# Patient Record
Sex: Male | Born: 1960 | Race: White | Hispanic: No | Smoking: Former smoker
Health system: Southern US, Community
[De-identification: ages and names within clinical notes are randomized; demographics above are authoritative.]

## PROBLEM LIST (undated history)

## (undated) DIAGNOSIS — E785 Hyperlipidemia, unspecified: Secondary | ICD-10-CM

## (undated) DIAGNOSIS — I10 Essential (primary) hypertension: Secondary | ICD-10-CM

## (undated) DIAGNOSIS — T7840XA Allergy, unspecified, initial encounter: Secondary | ICD-10-CM

## (undated) DIAGNOSIS — K219 Gastro-esophageal reflux disease without esophagitis: Secondary | ICD-10-CM

## (undated) DIAGNOSIS — Z9889 Other specified postprocedural states: Secondary | ICD-10-CM

## (undated) DIAGNOSIS — R112 Nausea with vomiting, unspecified: Secondary | ICD-10-CM

## (undated) HISTORY — DX: Essential (primary) hypertension: I10

## (undated) HISTORY — DX: Allergy, unspecified, initial encounter: T78.40XA

## (undated) HISTORY — DX: Hyperlipidemia, unspecified: E78.5

## (undated) HISTORY — PX: BACK SURGERY: SHX140

## (undated) HISTORY — PX: POLYPECTOMY: SHX149

## (undated) HISTORY — DX: Gastro-esophageal reflux disease without esophagitis: K21.9

## (undated) HISTORY — PX: HAND TENDON SURGERY: SHX663

---

## 2000-01-15 ENCOUNTER — Encounter: Payer: Self-pay | Admitting: Neurosurgery

## 2000-01-15 ENCOUNTER — Inpatient Hospital Stay (HOSPITAL_COMMUNITY): Admission: RE | Admit: 2000-01-15 | Discharge: 2000-01-17 | Payer: Self-pay | Admitting: Neurosurgery

## 2000-08-11 ENCOUNTER — Encounter: Payer: Self-pay | Admitting: Neurosurgery

## 2000-08-11 ENCOUNTER — Ambulatory Visit (HOSPITAL_COMMUNITY): Admission: RE | Admit: 2000-08-11 | Discharge: 2000-08-13 | Payer: Self-pay | Admitting: Neurosurgery

## 2006-11-03 ENCOUNTER — Ambulatory Visit: Payer: Self-pay | Admitting: Gastroenterology

## 2006-11-22 ENCOUNTER — Ambulatory Visit: Payer: Self-pay | Admitting: Gastroenterology

## 2006-11-22 ENCOUNTER — Encounter (INDEPENDENT_AMBULATORY_CARE_PROVIDER_SITE_OTHER): Payer: Self-pay | Admitting: Specialist

## 2007-03-09 ENCOUNTER — Ambulatory Visit (HOSPITAL_BASED_OUTPATIENT_CLINIC_OR_DEPARTMENT_OTHER): Admission: RE | Admit: 2007-03-09 | Discharge: 2007-03-09 | Payer: Self-pay | Admitting: Orthopedic Surgery

## 2010-12-29 NOTE — Op Note (Signed)
NAMEJAKHI, Jason Forbes                  ACCOUNT NO.:  [id number]   MEDICAL RECORD NO.:  [id number]          PATIENT TYPE:  AMB   LOCATION:  DSC                          FACILITY:  [facility]   PHYSICIAN:  Cindee Salt, M.D.       DATE OF BIRTH:  1960-09-15   DATE OF PROCEDURE:  03/09/2007  DATE OF DISCHARGE:                               OPERATIVE REPORT   PREOPERATIVE DIAGNOSIS:  Carpal tunnel syndrome left hand.   POSTOPERATIVE DIAGNOSIS:  Carpal tunnel syndrome left hand.   OPERATION:  Release left median nerve.   SURGEON:  Cindee Salt, M.D.   ASSISTANT:  Carolyne Fiscal R.N.   ANESTHESIA:  Forearm based IV regional with general anesthesia.   ANESTHESIOLOGIST:  Zenon Mayo, M.D.   HISTORY:  The patient is a 50 year old male with a history of carpal  tunnel syndrome, EMG nerve conductions positive.  This has not responded  to conservative treatment.  He is desirous of proceeding to have this  released.  He is aware of risks and complications including the  possibility of infection, recurrence, injury to arteries, nerves,  tendons, incomplete relief of symptoms, dystrophy.  He has elected to  proceed to have this done being aware of these risks and complications.  In the preoperative area, questions were encouraged and answered, the  extremity marked by both the patient and surgeon, antibiotic given.   PROCEDURE:  The patient is brought to the operating room where a forearm  based IV regional anesthetic was carried out without difficulty.  He was  prepped using DuraPrep, supine position, left arm free.  He had  considerable movement despite the anesthesia.  General anesthetic was  then carried out. After a dry time, he was draped.  A  longitudinal incision was made in the palm and carried down through the  subcutaneous tissue.  Bleeders were electrocauterized, palmar fascia was  split, superficial palmar arch was identified, flexor tendon of the ring  and little finger were  identified to the ulnar side of the median nerve.  The carpal retinaculum was incised with sharp dissection. Right angle  and Sewall retractor were placed between skin and forearm fascia.  The  fascia was released for approximately 1.5 cm proximal to the wrist  crease under direct vision. Considerable enlargement was noted to the  ulnar veins.  Guyon's cm canal was released. The area of constriction to  the nerve was apparent.  No further lesions were identified.  The wound  was irrigated.  The skin was closed with interrupted 5-0 Vicryl Rapide  sutures.  Sterile compressive dressing and splint applied.  The patient tolerated  the procedure well and was taken to the recovery room for observation in  satisfactory condition.  He is discharged home to return to the [facility] in one week on Vicodin.           ______________________________  Cindee Salt, M.D.     GK/MEDQ  D:  03/09/2007  T:  03/09/2007  Job:  [id number]   cc:   Lacretia Leigh. Quintella Reichert, M.D.

## 2011-01-01 NOTE — Op Note (Signed)
[place]. [facility]  Patient:    Jason Forbes, Jason Forbes                         MRN: [id number] Proc. Date: 01/15/00 Adm. Date:  [id number] Disc. Date: [id number] Attending:  Jackelyn Knife                           Operative Report  PREOPERATIVE DIAGNOSIS:  Ruptured intervertebral disc, left L5-S1.  POSTOPERATIVE DIAGNOSIS:  Ruptured intervertebral disc, left L5-S1.  OPERATION PERFORMED:  Left L5-S1 re-exploration and diskectomy.  SURGEON:  Izell [place]. Elesa Hacker, M.D.  ASSISTANT:  Mrs. Bonita Cox.  DESCRIPTION OF PROCEDURE:  Under general endotracheal anesthesia, the usual subperiosteal approach was carried out to the left L5-S1 interlaminar space. The proper operative level was confirmed with intraoperative x-rays.  Using the operating microscope and high-speed drill, the hemilaminotomy site was enlarged and the dural sac and nerve root identified and carefully retracted medially.  Initial efforts at retraction of the nerve root and dural sac were unsuccessful because of what proved to be a large soft tissue mass, which was a quite large extruded fragment, which was removed in one large piece.  Once this had been done, retraction was greatly facilitated.  A careful search was made for additional extruded fragments and none were found.  The interspace was cleaned of the few remaining bits of disk tissue. The wound was copiously irrigated with antibiotic solution and then closed in layers.  A sterile dressing was applied and the patient was taken to the recovery room in good condition. DD:  01/15/00 TD:  01/20/00 Job: [id number]

## 2011-01-01 NOTE — Discharge Summary (Signed)
[place]. [facility]  Patient:    Jason Forbes, Jason Forbes                         MRN: [id number] Adm. Date:  [id number] Disc. Date: [id number] Attending:  Jackelyn Knife                           Discharge Summary  SUMMARY:  Jason Forbes is a generally healthy 50 year old male who presented with low back with radiating left leg pain.  This has gone on for several weeks and did not respond to conservative measures.  A preoperative lumbosacral MRI study demonstrated a quite large recurrent ruptured intervertebral disk on the left at L5-S1.  Preoperative examination revealed severe dorsiflexion weakness on the left associated with significant sensory deficit involving L5 and S1.  After an appropriate preoperative work-up including an explanation of the goals and risks of the procedure, he was taken to the operating room on January 15, 2000.  He underwent a left L5-S1 hemilaminectomy and diskectomy with a finding of a quite large, complete extruded disk fragment.  His postoperative course was prolonged one day because of persistent postoperative nausea.  On the date of discharge, January 17, 2000, that nausea had cleared and he was up and about.  He had not yet had any improvement in his dorsiflexion weakness or sensory deficit.  He pain was much improved.  FINAL DIAGNOSIS:  Herniated intervertebral disk, left L5-S1 with a left L5 and S1 radiculopathy.  CONDITION ON DISCHARGE:  Improving.  INSTRUCTIONS: 1. Up ad lib with a 10 pound weightlifting restriction for four weeks. 2. Regular diet. 3. Back to see me in six days. 4. Discharge medication is Vicodin. DD:  01/17/00 TD:  01/20/00 Job: [id number]

## 2011-01-01 NOTE — Op Note (Signed)
[place]. [facility]  Patient:    Jason Forbes, Jason Forbes                         MRN: [id number] Proc. Date: 08/11/00 Adm. Date:  [id number] Attending:  Jackelyn Knife                           Operative Report  PREOPERATIVE DIAGNOSIS:  Recurrent ruptured intervertebral disk, left L5-S1.  POSTOPERATIVE DIAGNOSIS:  Recurrent ruptured intervertebral disk, left L5-S1.  OPERATION PERFORMED:  Re-exploration left L5-S1 with diskectomy.  SURGEON:  Izell [place]. Elesa Hacker, M.D.  ASSISTANT:  Clydene Fake, M.D.  ANESTHESIA:  General endotracheal.  DESCRIPTION OF PROCEDURE:  The left L5-S1 interlaminar space was exposed with care taken to protect the dura because of the amount of overlying epidural fibrotic tissue present.  A single cross-table lateral x-ray was taken to ensure the proper operative level.  Using the operating microscope and high speed drill the hemilaminotomy was enlarged in a cephalad direction so that the dura could be exposed and identified and then dissection could be carried caudally through the scar tissue.  This was done and the dural sac and nerve root were separated using blunt dissection.  The intervertebral disk space was identified and extruded disk material was noted in the area.  This was removed with a pituitary rongeurs.  The intervertebral disk space itself was then entered and a large quantity of partially extruded material was removed.  A careful search was then made for additional material and none was found.  The wound was copiously irrigated with antibiotic solution and then closed in layers.  A sterile dressing was applied and the patient was taken to recovery room in good condition. DD:  08/11/00 TD:  08/11/00 Job: [id number]
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
## 2011-05-31 LAB — I-STAT 8, (EC8 V) (CONVERTED LAB)
Acid-Base Excess: 2
BUN: 19
Bicarbonate: 25.7 — ABNORMAL HIGH
Chloride: 106
Glucose, Bld: 100 — ABNORMAL HIGH
HCT: 49
Hemoglobin: 16.7
Operator id: 123881
Potassium: 4
Sodium: 138
TCO2: 27
pCO2, Ven: 38.2 — ABNORMAL LOW
pH, Ven: 7.435 — ABNORMAL HIGH

## 2016-09-27 ENCOUNTER — Encounter: Payer: Self-pay | Admitting: Gastroenterology

## 2017-08-30 ENCOUNTER — Encounter: Payer: Self-pay | Admitting: Gastroenterology

## 2017-10-24 ENCOUNTER — Other Ambulatory Visit: Payer: Self-pay

## 2017-10-24 ENCOUNTER — Ambulatory Visit (AMBULATORY_SURGERY_CENTER): Payer: Self-pay

## 2017-10-24 VITALS — Ht 73.0 in | Wt 224.0 lb

## 2017-10-24 DIAGNOSIS — Z86010 Personal history of colonic polyps: Secondary | ICD-10-CM

## 2017-10-24 MED ORDER — NA SULFATE-K SULFATE-MG SULF 17.5-3.13-1.6 GM/177ML PO SOLN: 1.0000 | Freq: Once | ORAL | 0 refills | Status: AC

## 2017-10-24 NOTE — Progress Notes (Signed)
Denies allergies to eggs or soy products. Denies complication of anesthesia or sedation. Denies use of weight loss medication. Denies use of O2.   Emmi instructions declined.  

## 2017-10-25 ENCOUNTER — Encounter: Payer: Self-pay | Admitting: Gastroenterology

## 2017-11-07 ENCOUNTER — Encounter: Payer: Self-pay | Admitting: Gastroenterology

## 2017-11-07 ENCOUNTER — Other Ambulatory Visit: Payer: Self-pay

## 2017-11-07 ENCOUNTER — Ambulatory Visit (AMBULATORY_SURGERY_CENTER): Payer: PRIVATE HEALTH INSURANCE | Admitting: Gastroenterology

## 2017-11-07 VITALS — BP 125/78 | HR 61 | Temp 98.6°F | Resp 10 | Ht 73.0 in | Wt 224.0 lb

## 2017-11-07 DIAGNOSIS — D128 Benign neoplasm of rectum: Secondary | ICD-10-CM

## 2017-11-07 DIAGNOSIS — Z1211 Encounter for screening for malignant neoplasm of colon: Secondary | ICD-10-CM

## 2017-11-07 DIAGNOSIS — D125 Benign neoplasm of sigmoid colon: Secondary | ICD-10-CM | POA: Diagnosis not present

## 2017-11-07 DIAGNOSIS — D123 Benign neoplasm of transverse colon: Secondary | ICD-10-CM | POA: Diagnosis not present

## 2017-11-07 DIAGNOSIS — D129 Benign neoplasm of anus and anal canal: Secondary | ICD-10-CM

## 2017-11-07 DIAGNOSIS — Z1212 Encounter for screening for malignant neoplasm of rectum: Secondary | ICD-10-CM

## 2017-11-07 DIAGNOSIS — D124 Benign neoplasm of descending colon: Secondary | ICD-10-CM | POA: Diagnosis not present

## 2017-11-07 HISTORY — PX: COLONOSCOPY: SHX174

## 2017-11-07 MED ORDER — SODIUM CHLORIDE 0.9 % IV SOLN: 500.0000 mL | Freq: Once | INTRAVENOUS | Status: DC

## 2017-11-07 NOTE — Progress Notes (Signed)
Called to room to assist during endoscopic procedure.  Patient ID and intended procedure confirmed with present staff. Received instructions for my participation in the procedure from the performing physician.  

## 2017-11-07 NOTE — Op Note (Signed)
[place] Patient Name: Jason Forbes Procedure Date: 11/07/2017 9:49 AM MRN: [id number] Endoscopist: Ladene Artist , MD Age: 57 Referring MD:  Date of Birth: [birth year] Gender: Male Account #: [id number] Procedure:                Colonoscopy Indications:              Screening for colorectal malignant neoplasm Medicines:                Monitored Anesthesia Care Procedure:                Pre-Anesthesia Assessment:                           - Prior to the procedure, a History and Physical                            was performed, and patient medications and                            allergies were reviewed. The patient's tolerance of                            previous anesthesia was also reviewed. The risks                            and benefits of the procedure and the sedation                            options and risks were discussed with the patient.                            All questions were answered, and informed consent                            was obtained. Prior Anticoagulants: The patient has                            taken no previous anticoagulant or antiplatelet                            agents. ASA Grade Assessment: II - A patient with                            mild systemic disease. After reviewing the risks                            and benefits, the patient was deemed in                            satisfactory condition to undergo the procedure.                           After obtaining informed consent, the colonoscope  was passed under direct vision. Throughout the                            procedure, the patient's blood pressure, pulse, and                            oxygen saturations were monitored continuously. The                            Colonoscope was introduced through the anus and                            advanced to the the cecum, identified by                            appendiceal orifice and  ileocecal valve. The                            ileocecal valve, appendiceal orifice, and rectum                            were photographed. The quality of the bowel                            preparation was good. The colonoscopy was performed                            without difficulty. The patient tolerated the                            procedure well. Scope In: 10:01:56 AM Scope Out: 10:17:21 AM Scope Withdrawal Time: 0 hours 12 minutes 45 seconds  Total Procedure Duration: 0 hours 15 minutes 25 seconds  Findings:                 The perianal and digital rectal examinations were                            normal.                           A 8 mm polyp was found in the hepatic flexure. The                            polyp was semi-pedunculated. The polyp was removed                            with a cold snare. Resection and retrieval were                            complete.                           A 5 mm polyp was found in the descending colon. The  polyp was sessile. The polyp was removed with a                            cold biopsy forceps. Resection and retrieval were                            complete.                           Seven sessile polyps were found in the rectum (5)                            and sigmoid colon (2). The polyps were 5 to 6 mm in                            size. These polyps were removed with a cold snare.                            Resection and retrieval were complete.                           Internal hemorrhoids were found during                            retroflexion. The hemorrhoids were small and Grade                            I (internal hemorrhoids that do not prolapse).                           The exam was otherwise without abnormality on                            direct and retroflexion views. Complications:            No immediate complications. Estimated blood loss:                             None. Estimated Blood Loss:     Estimated blood loss: none. Impression:               - One 8 mm polyp at the hepatic flexure, removed                            with a cold snare. Resected and retrieved.                           - One 5 mm polyp in the descending colon, removed                            with a cold biopsy forceps. Resected and retrieved.                           - Seven 5 to 6 mm polyps in the rectum  and in the                            sigmoid colon, removed with a cold snare. Resected                            and retrieved.                           - Internal hemorrhoids.                           - The examination was otherwise normal on direct                            and retroflexion views. Recommendation:           - Repeat colonoscopy in 3 - 5 years for                            surveillance pending path review.                           - Patient has a contact number available for                            emergencies. The signs and symptoms of potential                            delayed complications were discussed with the                            patient. Return to normal activities tomorrow.                            Written discharge instructions were provided to the                            patient.                           - Resume previous diet.                           - Continue present medications.                           - Await pathology results. Ladene Artist, MD 11/07/2017 10:26:11 AM This report has been signed electronically.

## 2017-11-07 NOTE — Progress Notes (Signed)
Pt's states no medical or surgical changes since previsit or office visit. 

## 2017-11-07 NOTE — Progress Notes (Signed)
Report to PACU, RN, vss, BBS= Clear.  

## 2017-11-07 NOTE — Patient Instructions (Signed)
YOU HAD AN ENDOSCOPIC PROCEDURE TODAY AT THE [facility]:   Refer to the procedure report that was given to you for any specific questions about what was found during the examination.  If the procedure report does not answer your questions, please call your gastroenterologist to clarify.  If you requested that your care partner not be given the details of your procedure findings, then the procedure report has been included in a sealed envelope for you to review at your convenience later.  YOU SHOULD EXPECT: Some feelings of bloating in the abdomen. Passage of more gas than usual.  Walking can help get rid of the air that was put into your GI tract during the procedure and reduce the bloating. If you had a lower endoscopy (such as a colonoscopy or flexible sigmoidoscopy) you may notice spotting of blood in your stool or on the toilet paper. If you underwent a bowel prep for your procedure, you may not have a normal bowel movement for a few days.  Please Note:  You might notice some irritation and congestion in your nose or some drainage.  This is from the oxygen used during your procedure.  There is no need for concern and it should clear up in a day or so.  SYMPTOMS TO REPORT IMMEDIATELY:   Following lower endoscopy (colonoscopy or flexible sigmoidoscopy):  Excessive amounts of blood in the stool  Significant tenderness or worsening of abdominal pains  Swelling of the abdomen that is new, acute  Fever of 100F or higher  For urgent or emergent issues, a gastroenterologist can be reached at any hour by calling [phone].   DIET:  We do recommend a small meal at first, but then you may proceed to your regular diet.  Drink plenty of fluids but you should avoid alcoholic beverages for 24 hours.  MEDICATIONS: Continue present medications.  Please see handouts given to you by your recovery nurse.  ACTIVITY:  You should plan to take it easy for the rest of today and you should NOT  DRIVE or use heavy machinery until tomorrow (because of the sedation medicines used during the test).    FOLLOW UP: Our staff will call the number listed on your records the next business day following your procedure to check on you and address any questions or concerns that you may have regarding the information given to you following your procedure. If we do not reach you, we will leave a message.  However, if you are feeling well and you are not experiencing any problems, there is no need to return our call.  We will assume that you have returned to your regular daily activities without incident.  If any biopsies were taken you will be contacted by phone or by letter within the next 1-3 weeks.  Please call us at [phone] if you have not heard about the biopsies in 3 weeks.   Thank you for allowing us to provide for your healthcare needs today.   SIGNATURES/CONFIDENTIALITY: You and/or your care partner have signed paperwork which will be entered into your electronic medical record.  These signatures attest to the fact that that the information above on your After Visit Summary has been reviewed and is understood.  Full responsibility of the confidentiality of this discharge information lies with you and/or your care-partner. 

## 2017-11-08 ENCOUNTER — Telehealth: Payer: Self-pay | Admitting: *Deleted

## 2017-11-08 NOTE — Telephone Encounter (Signed)
  Follow up Call-  Call back number 11/07/2017  Post procedure Call Back phone  # [phone]  Permission to leave phone message Yes  Some recent data might be hidden     Patient questions:  Do you have a fever, pain , or abdominal swelling? No. Pain Score  0 *  Have you tolerated food without any problems? Yes.    Have you been able to return to your normal activities? Yes.    Do you have any questions about your discharge instructions: Diet   No. Medications  No. Follow up visit  No.  Do you have questions or concerns about your Care? No.  Actions: * If pain score is 4 or above: No action needed, pain <4.

## 2017-11-16 ENCOUNTER — Encounter: Payer: Self-pay | Admitting: Gastroenterology

## 2018-06-21 ENCOUNTER — Encounter: Payer: Self-pay | Admitting: Neurology

## 2018-06-22 ENCOUNTER — Other Ambulatory Visit: Payer: Self-pay | Admitting: *Deleted

## 2018-06-22 DIAGNOSIS — G5603 Carpal tunnel syndrome, bilateral upper limbs: Secondary | ICD-10-CM

## 2018-07-25 ENCOUNTER — Ambulatory Visit (INDEPENDENT_AMBULATORY_CARE_PROVIDER_SITE_OTHER): Payer: PRIVATE HEALTH INSURANCE | Admitting: Neurology

## 2018-07-25 DIAGNOSIS — G5603 Carpal tunnel syndrome, bilateral upper limbs: Secondary | ICD-10-CM | POA: Diagnosis not present

## 2018-07-25 NOTE — Procedures (Signed)
[facility]  [address], [address]  [place], [place] [address] Tel: [phone] Fax:  [phone] Test Date:  07/25/2018  Patient: Jason Forbes DOB: 1961/05/30 Physician: Narda Amber, DO  Sex: Male Height: 6\' 1"  Ref Phys: Daryll Brod, MD  ID#: [id number] Temp: 32.0C Technician:    Patient Complaints: This is a 57 year-old man with history of left CTS release referred for evaluation of right hand paresthesias.  NCV & EMG Findings: Extensive electrodiagnostic testing of the right upper extremity and additional studies of the left shows:  1. Right median sensory response shows prolonged distal peak latency (5.9 ms) and reduced amplitude (8.2 V).  Left median sensory response shows prolonged distal peak latency (4.2 ms) and normal amplitude.  Bilateral ulnar sensory responses are within normal limits. 2. Bilateral median motor responses show prolonged latency, which is worse on the right (R7.3, L4.3 ms).  Bilateral ulnar motor responses are within normal limits.   3. Chronic motor axonal loss changes are seen affecting the right abductor pollicis brevis muscle, without accompanied active denervation.    Impression: 1. Right median neuropathy at or distal to the wrist, consistent with a clinical diagnosis of carpal tunnel syndrome.  Overall, these findings are severe in degree electrically. 2. Left median neuropathy at or distal to the wrist, consistent with a clinical diagnosis of carpal tunnel syndrome, mild to-moderate in degree electrically.  These findings most likely represent the residuals of a previously treated left carpal tunnel syndrome.   ___________________________ Narda Amber, DO    Nerve Conduction Studies Anti Sensory Summary Table   Stim Site NR Peak (ms) Norm Peak (ms) P-T Amp (V) Norm P-T Amp  Left Median Anti Sensory (2nd Digit)  32C  Wrist    4.2 <3.6 17.9 >15  Right Median Anti Sensory (2nd Digit)  32C  Wrist    5.9 <3.6 8.2 >15  Left Ulnar  Anti Sensory (5th Digit)  32C  Wrist    3.0 <3.1 16.2 >10  Right Ulnar Anti Sensory (5th Digit)  32C  Wrist    2.8 <3.1 19.9 >10   Motor Summary Table   Stim Site NR Onset (ms) Norm Onset (ms) O-P Amp (mV) Norm O-P Amp Site1 Site2 Delta-0 (ms) Dist (cm) Vel (m/s) Norm Vel (m/s)  Left Median Motor (Abd Poll Brev)  32C  Wrist    4.3 <4.0 7.5 >6 Elbow Wrist 5.4 30.0 56 >50  Elbow    9.7  7.5         Right Median Motor (Abd Poll Brev)  32C  Wrist    7.3 <4.0 6.3 >6 Elbow Wrist 5.0 29.0 58 >50  Elbow    12.3  5.9         Left Ulnar Motor (Abd Dig Minimi)  32C  Wrist    2.3 <3.1 13.3 >7 B Elbow Wrist 4.9 26.0 53 >50  B Elbow    7.2  12.1  A Elbow B Elbow 2.0 10.0 50 >50  A Elbow    9.2  11.5         Right Ulnar Motor (Abd Dig Minimi)  32C  Wrist    2.1 <3.1 10.8 >7 B Elbow Wrist 3.9 24.0 62 >50  B Elbow    6.0  10.5  A Elbow B Elbow 1.9 10.0 53 >50  A Elbow    7.9  9.9          EMG   Side Muscle Ins Act Fibs Psw  Fasc Number Recrt Dur Dur. Amp Amp. Poly Poly. Comment  Right 1stDorInt Nml Nml Nml Nml Nml Nml Nml Nml Nml Nml Nml Nml N/A  Right Abd Poll Brev Nml Nml Nml Nml 1- Rapid Some 1+ Some 1+ Nml Nml N/A  Right PronatorTeres Nml Nml Nml Nml Nml Nml Nml Nml Nml Nml Nml Nml N/A  Right Biceps Nml Nml Nml Nml Nml Nml Nml Nml Nml Nml Nml Nml N/A  Right Triceps Nml Nml Nml Nml Nml Nml Nml Nml Nml Nml Nml Nml N/A  Right Deltoid Nml Nml Nml Nml Nml Nml Nml Nml Nml Nml Nml Nml N/A      Waveforms:

## 2018-07-28 ENCOUNTER — Other Ambulatory Visit: Payer: Self-pay | Admitting: Orthopedic Surgery

## 2018-08-04 ENCOUNTER — Encounter (HOSPITAL_BASED_OUTPATIENT_CLINIC_OR_DEPARTMENT_OTHER): Payer: Self-pay | Admitting: *Deleted

## 2018-08-04 ENCOUNTER — Other Ambulatory Visit: Payer: Self-pay

## 2018-08-10 ENCOUNTER — Encounter (HOSPITAL_BASED_OUTPATIENT_CLINIC_OR_DEPARTMENT_OTHER)
Admission: RE | Admit: 2018-08-10 | Discharge: 2018-08-10 | Disposition: A | Discharge status: Home / Self Care | Payer: PRIVATE HEALTH INSURANCE | Source: Ambulatory Visit | Attending: Orthopedic Surgery | Admitting: Orthopedic Surgery

## 2018-08-10 DIAGNOSIS — Z01818 Encounter for other preprocedural examination: Secondary | ICD-10-CM | POA: Diagnosis not present

## 2018-08-10 LAB — BASIC METABOLIC PANEL
Anion gap: 9 (ref 5–15)
BUN: 27 mg/dL — ABNORMAL HIGH (ref 6–20)
CO2: 27 mmol/L (ref 22–32)
Calcium: 9.4 mg/dL (ref 8.9–10.3)
Chloride: 101 mmol/L (ref 98–111)
Creatinine, Ser: 1.11 mg/dL (ref 0.61–1.24)
GFR calc Af Amer: >60 mL/min (ref >60)
GFR calc non Af Amer: >60 mL/min (ref >60)
Glucose, Bld: 107 mg/dL — ABNORMAL HIGH (ref 70–99)
Potassium: 4.6 mmol/L (ref 3.5–5.1)
Sodium: 137 mmol/L (ref 135–145)

## 2018-08-15 NOTE — Progress Notes (Signed)
Reviewed pts lab with Dr. Lissa Hoard, no new orders received .

## 2018-08-17 ENCOUNTER — Ambulatory Visit (HOSPITAL_BASED_OUTPATIENT_CLINIC_OR_DEPARTMENT_OTHER): Payer: PRIVATE HEALTH INSURANCE | Admitting: Anesthesiology

## 2018-08-17 ENCOUNTER — Ambulatory Visit (HOSPITAL_BASED_OUTPATIENT_CLINIC_OR_DEPARTMENT_OTHER)
Admission: RE | Admit: 2018-08-17 | Discharge: 2018-08-17 | Disposition: A | Discharge status: Home / Self Care | Payer: PRIVATE HEALTH INSURANCE | Attending: Orthopedic Surgery | Admitting: Orthopedic Surgery

## 2018-08-17 ENCOUNTER — Encounter (HOSPITAL_BASED_OUTPATIENT_CLINIC_OR_DEPARTMENT_OTHER)
Admission: RE | Disposition: A | Discharge status: Home / Self Care | Payer: Self-pay | Source: Home / Self Care | Attending: Orthopedic Surgery

## 2018-08-17 ENCOUNTER — Other Ambulatory Visit: Payer: Self-pay

## 2018-08-17 ENCOUNTER — Encounter (HOSPITAL_BASED_OUTPATIENT_CLINIC_OR_DEPARTMENT_OTHER): Payer: Self-pay | Admitting: Anesthesiology

## 2018-08-17 DIAGNOSIS — G5601 Carpal tunnel syndrome, right upper limb: Secondary | ICD-10-CM | POA: Insufficient documentation

## 2018-08-17 DIAGNOSIS — K219 Gastro-esophageal reflux disease without esophagitis: Secondary | ICD-10-CM | POA: Diagnosis not present

## 2018-08-17 DIAGNOSIS — Z885 Allergy status to narcotic agent status: Secondary | ICD-10-CM | POA: Diagnosis not present

## 2018-08-17 DIAGNOSIS — Z87891 Personal history of nicotine dependence: Secondary | ICD-10-CM | POA: Insufficient documentation

## 2018-08-17 DIAGNOSIS — E785 Hyperlipidemia, unspecified: Secondary | ICD-10-CM | POA: Diagnosis not present

## 2018-08-17 DIAGNOSIS — I10 Essential (primary) hypertension: Secondary | ICD-10-CM | POA: Diagnosis not present

## 2018-08-17 HISTORY — PX: CARPAL TUNNEL RELEASE: SHX101

## 2018-08-17 HISTORY — DX: Other specified postprocedural states: Z98.890

## 2018-08-17 HISTORY — DX: Other specified postprocedural states: R11.2

## 2018-08-17 SURGERY — CARPAL TUNNEL RELEASE
Anesthesia: General | Site: Wrist | Laterality: Right

## 2018-08-17 MED ORDER — PROPOFOL 10 MG/ML IV BOLUS
INTRAVENOUS | Status: DC | PRN
  Administered 2018-08-17: 140 mg via INTRAVENOUS
  Administered 2018-08-17 (×3): 20 mg via INTRAVENOUS

## 2018-08-17 MED ORDER — SCOPOLAMINE 1 MG/3DAYS TD PT72: 1.0000 | MEDICATED_PATCH | Freq: Once | TRANSDERMAL | Status: DC | PRN

## 2018-08-17 MED ORDER — LIDOCAINE 2% (20 MG/ML) 5 ML SYRINGE
INTRAMUSCULAR | Status: AC
  Filled 2018-08-17: qty 5

## 2018-08-17 MED ORDER — MIDAZOLAM HCL 2 MG/2ML IJ SOLN
1.0000 mg | INTRAMUSCULAR | Status: DC | PRN
  Administered 2018-08-17: 2 mg via INTRAVENOUS

## 2018-08-17 MED ORDER — ONDANSETRON HCL 4 MG/2ML IJ SOLN
INTRAMUSCULAR | Status: AC
  Filled 2018-08-17: qty 2

## 2018-08-17 MED ORDER — CEFAZOLIN SODIUM-DEXTROSE 2-4 GM/100ML-% IV SOLN
INTRAVENOUS | Status: AC
  Filled 2018-08-17: qty 100

## 2018-08-17 MED ORDER — BUPIVACAINE HCL (PF) 0.25 % IJ SOLN
INTRAMUSCULAR | Status: DC | PRN
  Administered 2018-08-17: 9 mL

## 2018-08-17 MED ORDER — FENTANYL CITRATE (PF) 100 MCG/2ML IJ SOLN
50.0000 ug | INTRAMUSCULAR | Status: DC | PRN
  Administered 2018-08-17: 100 ug via INTRAVENOUS

## 2018-08-17 MED ORDER — FENTANYL CITRATE (PF) 100 MCG/2ML IJ SOLN
INTRAMUSCULAR | Status: AC
  Filled 2018-08-17: qty 2

## 2018-08-17 MED ORDER — FENTANYL CITRATE (PF) 100 MCG/2ML IJ SOLN: 25.0000 ug | INTRAMUSCULAR | Status: DC | PRN

## 2018-08-17 MED ORDER — HYDROCODONE-ACETAMINOPHEN 5-325 MG PO TABS: 1.0000 | ORAL_TABLET | Freq: Four times a day (QID) | ORAL | 0 refills | Status: DC | PRN

## 2018-08-17 MED ORDER — MIDAZOLAM HCL 2 MG/2ML IJ SOLN
INTRAMUSCULAR | Status: AC
  Filled 2018-08-17: qty 2

## 2018-08-17 MED ORDER — ONDANSETRON HCL 4 MG/2ML IJ SOLN
INTRAMUSCULAR | Status: DC | PRN
  Administered 2018-08-17: 4 mg via INTRAVENOUS

## 2018-08-17 MED ORDER — LIDOCAINE HCL (CARDIAC) PF 100 MG/5ML IV SOSY
PREFILLED_SYRINGE | INTRAVENOUS | Status: DC | PRN
  Administered 2018-08-17: 20 mg via INTRAVENOUS

## 2018-08-17 MED ORDER — CEFAZOLIN SODIUM-DEXTROSE 2-4 GM/100ML-% IV SOLN
2.0000 g | INTRAVENOUS | Status: AC
  Administered 2018-08-17: 2 g via INTRAVENOUS

## 2018-08-17 MED ORDER — DEXAMETHASONE SODIUM PHOSPHATE 10 MG/ML IJ SOLN
INTRAMUSCULAR | Status: AC
  Filled 2018-08-17: qty 1

## 2018-08-17 MED ORDER — LIDOCAINE HCL (PF) 0.5 % IJ SOLN
INTRAMUSCULAR | Status: DC | PRN
  Administered 2018-08-17: 35 mL via INTRAVENOUS

## 2018-08-17 MED ORDER — CHLORHEXIDINE GLUCONATE 4 % EX LIQD: 60.0000 mL | Freq: Once | CUTANEOUS | Status: DC

## 2018-08-17 MED ORDER — LACTATED RINGERS IV SOLN
INTRAVENOUS | Status: DC
  Administered 2018-08-17: 09:00:00 via INTRAVENOUS

## 2018-08-17 MED ORDER — DEXAMETHASONE SODIUM PHOSPHATE 10 MG/ML IJ SOLN
INTRAMUSCULAR | Status: DC | PRN
  Administered 2018-08-17: 10 mg via INTRAVENOUS

## 2018-08-17 SURGICAL SUPPLY — 40 items
BLADE SURG 15 STRL LF DISP TIS (BLADE) ×1 IMPLANT
BLADE SURG 15 STRL SS (BLADE) ×3
BNDG CMPR 9X4 STRL LF SNTH (GAUZE/BANDAGES/DRESSINGS)
BNDG COHESIVE 3X5 TAN STRL LF (GAUZE/BANDAGES/DRESSINGS) ×3 IMPLANT
BNDG ESMARK 4X9 LF (GAUZE/BANDAGES/DRESSINGS) IMPLANT
BNDG GAUZE ELAST 4 BULKY (GAUZE/BANDAGES/DRESSINGS) ×3 IMPLANT
CHLORAPREP W/TINT 26ML (MISCELLANEOUS) ×3 IMPLANT
CORD BIPOLAR FORCEPS 12FT (ELECTRODE) ×3 IMPLANT
COVER BACK TABLE 60X90IN (DRAPES) ×3 IMPLANT
COVER MAYO STAND STRL (DRAPES) ×3 IMPLANT
COVER WAND RF STERILE (DRAPES) IMPLANT
CUFF TOURNIQUET SINGLE 18IN (TOURNIQUET CUFF) ×3 IMPLANT
DRAPE EXTREMITY T 121X128X90 (DISPOSABLE) ×3 IMPLANT
DRAPE SURG 17X23 STRL (DRAPES) ×3 IMPLANT
DRSG PAD ABDOMINAL 8X10 ST (GAUZE/BANDAGES/DRESSINGS) ×3 IMPLANT
GAUZE SPONGE 4X4 12PLY STRL (GAUZE/BANDAGES/DRESSINGS) ×3 IMPLANT
GAUZE XEROFORM 1X8 LF (GAUZE/BANDAGES/DRESSINGS) ×3 IMPLANT
GLOVE BIOGEL PI IND STRL 8 (GLOVE) IMPLANT
GLOVE BIOGEL PI IND STRL 8.5 (GLOVE) ×1 IMPLANT
GLOVE BIOGEL PI INDICATOR 8 (GLOVE) ×4
GLOVE BIOGEL PI INDICATOR 8.5 (GLOVE) ×2
GLOVE SURG ORTHO 8.0 STRL STRW (GLOVE) ×3 IMPLANT
GLOVE SURG SYN 8.0 (GLOVE) ×3 IMPLANT
GLOVE SURG SYN 8.0 PF PI (GLOVE) IMPLANT
GOWN STRL REIN XL XLG (GOWN DISPOSABLE) ×2 IMPLANT
GOWN STRL REUS W/ TWL LRG LVL3 (GOWN DISPOSABLE) ×1 IMPLANT
GOWN STRL REUS W/TWL LRG LVL3 (GOWN DISPOSABLE)
GOWN STRL REUS W/TWL XL LVL3 (GOWN DISPOSABLE) ×3 IMPLANT
NDL PRECISIONGLIDE 27X1.5 (NEEDLE) IMPLANT
NEEDLE PRECISIONGLIDE 27X1.5 (NEEDLE) ×3 IMPLANT
NS IRRIG 1000ML POUR BTL (IV SOLUTION) ×3 IMPLANT
PACK BASIN DAY SURGERY FS (CUSTOM PROCEDURE TRAY) ×3 IMPLANT
SLING ARM FOAM STRAP LRG (SOFTGOODS) ×2 IMPLANT
STOCKINETTE 4X48 STRL (DRAPES) ×3 IMPLANT
SUT ETHILON 4 0 PS 2 18 (SUTURE) ×3 IMPLANT
SUT VICRYL 4-0 PS2 18IN ABS (SUTURE) IMPLANT
SYR BULB 3OZ (MISCELLANEOUS) ×3 IMPLANT
SYR CONTROL 10ML LL (SYRINGE) ×2 IMPLANT
TOWEL GREEN STERILE FF (TOWEL DISPOSABLE) ×3 IMPLANT
UNDERPAD 30X30 (UNDERPADS AND DIAPERS) ×3 IMPLANT

## 2018-08-17 NOTE — Discharge Instructions (Addendum)

## 2018-08-17 NOTE — Anesthesia Procedure Notes (Signed)
Procedure Name: MAC Performed by: Namish Krise W, CRNA Pre-anesthesia Checklist: Patient identified, Emergency Drugs available, Suction available, Patient being monitored and Timeout performed Oxygen Delivery Method: Simple face mask       

## 2018-08-17 NOTE — Anesthesia Procedure Notes (Signed)
Procedure Name: LMA Insertion Performed by: Chassity Ludke W, CRNA Pre-anesthesia Checklist: Patient identified, Emergency Drugs available, Suction available and Patient being monitored Patient Re-evaluated:Patient Re-evaluated prior to induction Oxygen Delivery Method: Circle system utilized Preoxygenation: Pre-oxygenation with 100% oxygen Induction Type: IV induction Ventilation: Mask ventilation without difficulty LMA: LMA inserted LMA Size: 5.0 Number of attempts: 1 Placement Confirmation: positive ETCO2 Tube secured with: Tape Dental Injury: Teeth and Oropharynx as per pre-operative assessment        

## 2018-08-17 NOTE — Anesthesia Postprocedure Evaluation (Signed)
Anesthesia Post Note  Patient: Jason Forbes  Procedure(s) Performed: RIGHT CARPAL TUNNEL RELEASE (Right Wrist)     Patient location during evaluation: PACU Anesthesia Type: General Level of consciousness: awake and alert Pain management: pain level controlled Vital Signs Assessment: post-procedure vital signs reviewed and stable Respiratory status: spontaneous breathing, nonlabored ventilation, respiratory function stable and patient connected to nasal cannula oxygen Cardiovascular status: blood pressure returned to baseline and stable Postop Assessment: no apparent nausea or vomiting Anesthetic complications: no    Last Vitals:  Vitals:   08/17/18 1109 08/17/18 1125  BP: (!) 145/89 (!) 144/70  Pulse: (!) 53 (!) 55  Resp: 14 16  Temp:  36.5 C  SpO2: 100% 100%    Last Pain:  Vitals:   08/17/18 1125  TempSrc:   PainSc: 0-No pain                 Ebba Goll L Diandra Cimini

## 2018-08-17 NOTE — Transfer of Care (Signed)
Immediate Anesthesia Transfer of Care Note  Patient: Marisa Severin Gangwer  Procedure(s) Performed: RIGHT CARPAL TUNNEL RELEASE (Right Wrist)  Patient Location: PACU  Anesthesia Type:MAC, General and Bier block  Level of Consciousness: awake and sedated  Airway & Oxygen Therapy: Patient Spontanous Breathing and Patient connected to face mask oxygen  Post-op Assessment: Report given to RN and Post -op Vital signs reviewed and stable  Post vital signs: Reviewed and stable  Last Vitals:  Vitals Value Taken Time  BP 138/64 08/17/2018 10:47 AM  Temp    Pulse 67 08/17/2018 10:47 AM  Resp 9 08/17/2018 10:47 AM  SpO2 100 % 08/17/2018 10:47 AM  Vitals shown include unvalidated device data.  Last Pain:  Vitals:   08/17/18 0907  TempSrc: Oral         Complications: No apparent anesthesia complications

## 2018-08-17 NOTE — Anesthesia Procedure Notes (Signed)
Anesthesia Regional Block: Bier block (IV Regional)   Pre-Anesthetic Checklist: ,, timeout performed, Correct Patient, Correct Site, Correct Laterality, Correct Procedure,, site marked, surgical consent,, at surgeon's request  Laterality: Left     Needles:  Injection technique: Single-shot  Needle Type: Other      Needle Gauge: 20     Additional Needles:   Procedures:,,,,, intact distal pulses, Esmarch exsanguination, single tourniquet utilized,  Narrative:   Performed by: Personally       

## 2018-08-17 NOTE — H&P (Signed)
  Jason Forbes is an 58 y.o. male.   Chief Complaint: numbness right hand HPI: Jason Forbes is a 58 yo male  complaining of numbness and tingling right hand.Marland Kitchen He is undergone a carpal tunnel release on his left side. He is doing quite well with this. Complaining of numbness and tingling median nerve distribution on his right side. Gradually increasing. 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 He states that he frequently has to shake his hand if he writes his motorcycle he has 5 minutes before his right hand goes numb thumb through ring fingers. He is awakened 7 out of 7 nights. Is been wearing a brace. He states if he holds his hand straight it feels better. He is not taking anything for this. Is no history of diabetes thyroid problems arthritis or gout. Family history is negative for each of these also. He was referred for nerve conductions.He has had nerve conductions done by Dr. Posey Pronto revealing a carpal tunnel syndrome on his right side with a motor delay of 7.3 sensory delay of 5.9 he still has minor changes in his left post carpal tunnel release on nerve conductions.      Past Medical History:  Diagnosis Date  . Allergy   . GERD (gastroesophageal reflux disease)   . Hyperlipidemia   . Hypertension   . PONV (postoperative nausea and vomiting)    r/t morphine    Past Surgical History:  Procedure Laterality Date  . BACK SURGERY     Back surgery x 3  . HAND TENDON SURGERY Right    Hand surgery x#    Family History  Problem Relation Age of Onset  . Colon cancer Neg Hx   . Esophageal cancer Neg Hx   . Liver cancer Neg Hx   . Pancreatic cancer Neg Hx   . Rectal cancer Neg Hx   . Stomach cancer Neg Hx    Social History:  reports that he quit smoking about 13 years ago. He has quit using smokeless tobacco. He reports current alcohol use. He reports that he does not use drugs.  Allergies:  Allergies  Allergen Reactions  . Morphine And Related Nausea Only    No  medications prior to admission.    No results found for this or any previous visit (from the past 48 hour(s)).  No results found.   Pertinent items are noted in HPI.  Height 6\' 1"  (1.854 m), weight 103.4 kg.  General appearance: alert, cooperative and appears stated age Head: Normocephalic, without obvious abnormality Neck: no JVD Resp: clear to auscultation bilaterally Cardio: regular rate and rhythm, S1, S2 normal, no murmur, click, rub or gallop GI: soft, non-tender; bowel sounds normal; no masses,  no organomegaly Extremities: numbness right hand Pulses: 2+ and symmetric Skin: Skin color, texture, turgor normal. No rashes or lesions Neurologic: Grossly normal Incision/Wound: na  Assessment/Plan Assessment:  1. Carpal tunnel syndrome of right wrist    Plan: Is desires having the right carpal tunnel release. Pre-peri-and postoperative course are discussed along with risks and complications. He is aware that there is no guarantee to the surgery the possibility of infection recurrence injury to arteries nerves tendons complete relief symptoms dystrophy. He is scheduled for right carpal tunnel release in outpatient under regional anesthesia.   Daryll Brod 08/17/2018, 5:05 AM

## 2018-08-17 NOTE — Brief Op Note (Signed)
08/17/2018  10:41 AM  PATIENT:  Jason Forbes  58 y.o. male  PRE-OPERATIVE DIAGNOSIS:  RIGHT CARPAL TUNNEL SYNDROME  POST-OPERATIVE DIAGNOSIS:  RIGHT CARPAL TUNNEL SYNDROME  PROCEDURE:  Procedure(s): RIGHT CARPAL TUNNEL RELEASE (Right)  SURGEON:  Surgeon(s) and Role:    * Daryll Brod, MD - Primary  PHYSICIAN ASSISTANT:   ASSISTANTS: none   ANESTHESIA:   local, regional and general  EBL:  1 mL   BLOOD ADMINISTERED:none  DRAINS: none   LOCAL MEDICATIONS USED:  BUPIVICAINE   SPECIMEN:  No Specimen  DISPOSITION OF SPECIMEN:  N/A  COUNTS:  YES  TOURNIQUET:   Total Tourniquet Time Documented: Forearm (Right) - 27 minutes Total: Forearm (Right) - 27 minutes   DICTATION: .Viviann Spare Dictation  PLAN OF CARE: Discharge to home after PACU  PATIENT DISPOSITION:  PACU - hemodynamically stable.

## 2018-08-17 NOTE — Anesthesia Preprocedure Evaluation (Addendum)
Anesthesia Evaluation  Patient identified by MRN, date of birth, ID band Patient awake    Reviewed: Allergy & Precautions, NPO status , Patient's Chart, lab work & pertinent test results  History of Anesthesia Complications (+) PONV and history of anesthetic complications  Airway Mallampati: I  TM Distance: >3 FB Neck ROM: Full    Dental no notable dental hx. (+) Teeth Intact, Dental Advisory Given   Pulmonary neg pulmonary ROS, former smoker,    Pulmonary exam normal breath sounds clear to auscultation       Cardiovascular hypertension, Pt. on medications negative cardio ROS Normal cardiovascular exam Rhythm:Regular Rate:Normal     Neuro/Psych negative neurological ROS  negative psych ROS   GI/Hepatic Neg liver ROS, GERD  Medicated,  Endo/Other  negative endocrine ROS  Renal/GU negative Renal ROS  negative genitourinary   Musculoskeletal negative musculoskeletal ROS (+)   Abdominal   Peds  Hematology negative hematology ROS (+)   Anesthesia Other Findings Right carpal tunnel syndrome  Reproductive/Obstetrics                            Anesthesia Physical Anesthesia Plan  ASA: II  Anesthesia Plan: Bier Block and General and Bier Block-LIDOCAINE ONLY   Post-op Pain Management:    Induction: Intravenous  PONV Risk Score and Plan: 2 and Ondansetron, Dexamethasone, Midazolam, Treatment may vary due to age or medical condition and Propofol infusion  Airway Management Planned: LMA  Additional Equipment:   Intra-op Plan:   Post-operative Plan: Extubation in OR  Informed Consent: I have reviewed the patients History and Physical, chart, labs and discussed the procedure including the risks, benefits and alternatives for the proposed anesthesia with the patient or authorized representative who has indicated his/her understanding and acceptance.   Dental advisory given  Plan  Discussed with: CRNA  Anesthesia Plan Comments:        Anesthesia Quick Evaluation

## 2018-08-17 NOTE — Op Note (Signed)
NAME: Jason Forbes MEDICAL RECORD NO: [id number] DATE OF BIRTH: 1961/03/20 FAC[facility] LOCATION: [facility] PHYSICIAN: Wynonia Sours, MD   OPERATIVE REPORT   DATE OF PROCEDURE: 08/17/18    PREOPERATIVE DIAGNOSIS: Carpal tunnel syndrome right hand   POSTOPERATIVE DIAGNOSIS:   Same   PROCEDURE:  Decompression median nerve right hand   SURGEON: Daryll Brod, M.D.   ASSISTANT: none   ANESTHESIA:  General, Bier block with sedation and Local   INTRAVENOUS FLUIDS:  Per anesthesia flow sheet.   ESTIMATED BLOOD LOSS:  Minimal.   COMPLICATIONS:  None.   SPECIMENS:  none   TOURNIQUET TIME:    Total Tourniquet Time Documented: Forearm (Right) - 27 minutes Total: Forearm (Right) - 27 minutes    DISPOSITION:  Stable to PACU.   INDICATIONS: Patient is a 58 year old male with a history of numbness and tingling to his right hand.  Is undergone carpal tunnel release on the left side the past.  Nerve conductions are positive for carpal tunnel syndrome on his right side.  He has elected to undergo surgical decompression.  Pre-peri-and postoperative course been discussed along with risks and complications.  He is aware that there is no guarantee to the surgery the possibility of infection recurrence injury to arteries nerves tendons and complete relief of symptoms and dystrophy.  In the preoperative area the patient is seen extremity marked by both patient and surgeon antibiotic given  OPERATIVE COURSE: Patient is brought to the operating room where a forearm-based IV regional anesthetic was carried out without difficulty under the direction the anesthesia department.  He was prepped using ChloraPrep in the supine position with the right arm free.  A three-minute dry time was allowed timeout taken to confirm patient procedure.  A longitudinal incision was made in the right palm carried down through subcutaneous tissue.  Had some feeling a block was supplemented with local  anesthetic he still had some feeling a general anesthetic was given.  The dissection was carried down through the subcutaneous tissue with blunt dissection.  The palmar fascia was split.  The superficial palmar arch was identified.  The flexor tendon to the ring little finger was identified.  To the ulnar side of the median nerve the carpal retinaculum was incised with sharp dissection.  A right angled saw retractor was placed between skin and forearm fascia.  The fascia was released for approximately 2 to 3 cm proximal to the wrist crease under direct vision.  Canal was explored.  No further lesions were identified.  Motor branch entered in the muscle distally.  An area compression of the nerve was apparent.  No further lesions were noted.  His wound was copiously irrigated with saline.  The skin was closed interrupted 4-0 nylon sutures.  A further infiltration with quarter percent bupivacaine without epinephrine for a total of 8 cc was given.  Sterile compressive dressing with the fingers free was applied.  Inflation of the tourniquet all fingers immediately pink.  He was taken to the recovery room for observation in satisfactory condition.  He will be discharged home to return to [facility] Tylenol or ibuprofen with Norco for breakthrough.   Daryll Brod, MD Electronically signed, 08/17/18

## 2018-08-18 ENCOUNTER — Encounter (HOSPITAL_BASED_OUTPATIENT_CLINIC_OR_DEPARTMENT_OTHER): Payer: Self-pay | Admitting: Orthopedic Surgery

## 2019-10-08 ENCOUNTER — Ambulatory Visit: Payer: PRIVATE HEALTH INSURANCE | Admitting: Cardiology

## 2020-06-09 ENCOUNTER — Ambulatory Visit: Payer: PRIVATE HEALTH INSURANCE | Admitting: Family Medicine

## 2020-06-10 ENCOUNTER — Ambulatory Visit
Admission: RE | Admit: 2020-06-10 | Discharge: 2020-06-10 | Disposition: A | Discharge status: Home / Self Care | Payer: PRIVATE HEALTH INSURANCE | Source: Ambulatory Visit | Attending: Sports Medicine | Admitting: Sports Medicine

## 2020-06-10 ENCOUNTER — Other Ambulatory Visit: Payer: Self-pay

## 2020-06-10 ENCOUNTER — Ambulatory Visit: Payer: PRIVATE HEALTH INSURANCE | Admitting: Sports Medicine

## 2020-06-10 VITALS — BP 132/82 | Ht 73.0 in | Wt 227.0 lb

## 2020-06-10 DIAGNOSIS — M25551 Pain in right hip: Secondary | ICD-10-CM

## 2020-06-10 DIAGNOSIS — M21372 Foot drop, left foot: Secondary | ICD-10-CM | POA: Insufficient documentation

## 2020-06-10 DIAGNOSIS — G8929 Other chronic pain: Secondary | ICD-10-CM

## 2020-06-10 DIAGNOSIS — M545 Low back pain, unspecified: Secondary | ICD-10-CM

## 2020-06-10 HISTORY — DX: Low back pain, unspecified: M54.50

## 2020-06-10 HISTORY — DX: Pain in right hip: M25.551

## 2020-06-10 HISTORY — DX: Foot drop, left foot: M21.372

## 2020-06-10 NOTE — Patient Instructions (Signed)
We will send you for x-rays of your back and your right hip.  Go to [place] at [facility].  You do not need an appointment.  We will see about getting you a brace for your foot drop and call you in regards to this.  We will also call about your x-rays when we get them back.  We will plan to coordinate a visit in the future with you to be able to get your brace and an injection in your hip at the same time.  The hip injection will help [place] to decide if your hip arthritis is causing the pain or if it is coming from your back.  Your foot pain seems to be coming from your back.

## 2020-06-10 NOTE — Assessment & Plan Note (Signed)
Suspect that his foot pain is secondary to nerve root compression in his back given that the pain is burning in his bilateral feet and his feet are without significant abnormality.  Will obtain lumbar films for updated imaging to further assess.  We will discuss further plans with patient after imaging is obtained.

## 2020-06-10 NOTE — Assessment & Plan Note (Signed)
He does have some pain with extreme of internal rotation and with radiation to groin, it is possible that he has some intra-articular hip arthritis that is causing some of the symptoms.  With his significant low back history, it is also possible that he is having pain radiating from his low back and nerve compression.  Discussed with patient that we will obtain x-rays of right hip as well as lumbar spine to further assess.  We will work on getting him back for intra-articular injection for treatment as well as diagnostic purposes to see if this improves his pain.  If pain is improved, would support hip as the source of his pain.

## 2020-06-10 NOTE — Progress Notes (Signed)
Jason Forbes is a 59 y.o. 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  He reports that his surgery was performed by Dr. Rolin Barry, states that he did not have a fusion.  Also reports that he had a bone graft from the right hip in 1988.  Right Hip Pain He presents today to discuss several years of off-and-on right hip pain States that the pain is always worse when he is sitting only, mostly when his hip is in a slightly flexed position Feels pulling in his groin as well as pain in his buttocks to his thigh with numbness and aching in the area Reports that this occurs when he is driving States that he does not carry a wallet States that once he is able to get up and move his leg, the pain improves He was seen by Dr. 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 Chronic left foot drop ROS as above. Medications reviewed.  Exam:  BP 132/82   Ht 6\' 1"  (1.854 m)   Wt 227 lb (103 kg)   BMI 29.95 kg/m  Gen: Well NAD MSK:  Right Hip:  - Inspection: No gross deformity, no swelling, erythema, or ecchymosis b/l - Palpation: No TTP, specifically none over greater trochanter b/l, some TTP right sciatic notch and piriformin - ROM: Normal range of motion on Flexion, extension, abduction,  internal and external rotation b/l, some pain in right hip with extreme of internal rotation - Strength: Normal strength in all fields b/l. - Neuro/vasc: NV intact distally - Special Tests: Negative FABER and FADIR b/l.  Negative Log roll b/l.  Bilateral Feet:  Inspection:  No obvious bony deformity.  No swelling, erythema, or bruising.  Normal arch.  Mild collapse of transverse arch with splaying of first of second toes on right.   Palpation: Mild TTP right origin of plantar fascia on right, palpable plantar fascia on right with mild tenderness.  No tenderness palpation of left plantar fascia origin. ROM: Full  ROM of the ankle b/l. Normal midfoot flexibility Strength: 5/5 strength ankle in all planes on right Neurovascular: N/V intact distally in the lower extremity on right, decreased sensation on lateral lower left leg and 4/5 strength in all fields of left lower leg/ankle. Special tests: Negative anterior drawer, talar tilt bilateral. Negative squeeze.  Gait: Foot drop on left    No results found.   Assessment and Plan: 1) Right hip pain He does have some pain with extreme of internal rotation and with radiation to groin, it is possible that he has some intra-articular hip arthritis that is causing some of the symptoms.  With his significant low back history, it is also possible that he  is having pain radiating from his low back and nerve compression.  Discussed with patient that we will obtain x-rays of right hip as well as lumbar spine to further assess.  We will work on getting him back for intra-articular injection for treatment as well as diagnostic purposes to see if this improves his pain.  If pain is improved, would support hip as the source of his pain.  Chronic low back pain Suspect that his foot pain is secondary to nerve root compression in his back given that the pain is burning in his bilateral feet and his feet are without significant abnormality.  Will obtain lumbar films  for updated imaging to further assess.  We will discuss further plans with patient after imaging is obtained.  Foot drop, left Chronic left foot drop secondary to patient's previous lumbar problems.  He has never used a brace.  We will work on getting him an Allred brace for his safety to avoid falling.  Once this is set up, plan to bring him back the same day to get femoral acetabular corticosteroid injection as he lives in [place].   [place] Constable, D.O.  PGY-3 Family Medicine  06/10/2020 4:48 PM   Patient seen and evaluated with the resident.  I agree with the above plan of care.  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  We will order an x-ray to evaluate further.  Patient's bilateral foot pain is neuropathic in nature, likely due to his low back.  I will also get x-rays of his lumbar spine. Patient would like to try an AFO for his left foot drop.  I think he would benefit from an Allred AFO so we will start the process of ordering one for him. Patient lives in [place] so when he returns to [facility] for his brace then we may coordinate a diagnostic/therapeutic right hip intra-articular cortisone injection.

## 2020-06-10 NOTE — Assessment & Plan Note (Signed)
Chronic left foot drop secondary to patient's previous lumbar problems.  He has never used a brace.  We will work on getting him an Allred brace for his safety to avoid falling.  Once this is set up, plan to bring him back the same day to get femoral acetabular corticosteroid injection as he lives in [place].

## 2020-06-12 MED ORDER — AMBULATORY NON FORMULARY MEDICATION: 0 refills | Status: DC

## 2020-06-12 NOTE — Addendum Note (Signed)
Addended by: Cyd Silence on: 06/12/2020 11:25 AM   Modules accepted: Orders

## 2020-06-13 ENCOUNTER — Telehealth: Payer: Self-pay | Admitting: Sports Medicine

## 2020-06-13 NOTE — Telephone Encounter (Signed)
  I spoke with the patient on the phone yesterday after reviewing x-rays of his right hip and his lumbar spine.  Right hip x-ray shows mild degenerative changes.  Lumbar spine x-ray shows spondylosis at L4/L5 and L5/S1 with concomitant facet hypertrophy.  I discussed the possibility of a diagnostic/therapeutic right hip injection but the patient would like to hold on that for now.  We also placed an order for his Allrad AFO for his left foot. [employer] we'll fit him for that brace, and I have asked him to call me when this occurs.  I would like to plan on setting up a follow-up appointment a couple of weeks later to see how the brace is functioning for him.  We also discussed custom orthotics for his chronic bilateral foot pain which very well may be coming from his lumbar spine.  Although he has already spent quite a bit of money on off-the-shelf orthotics, he is willing to try our orthotics too.

## 2020-06-18 ENCOUNTER — Ambulatory Visit: Payer: PRIVATE HEALTH INSURANCE | Admitting: Family Medicine

## 2020-06-19 ENCOUNTER — Telehealth: Payer: Self-pay | Admitting: Sports Medicine

## 2020-06-19 NOTE — Telephone Encounter (Signed)
Forwarding message to med team thae Harrington Challenger @ [employer] wants to switch pt's Allred brace to a Lt wt carbon brace --pt has tried it on & is loving the feel. Call [facility] @ [phone].  --Dion Body

## 2020-10-28 ENCOUNTER — Encounter: Payer: PRIVATE HEALTH INSURANCE | Admitting: Sports Medicine

## 2020-12-30 ENCOUNTER — Encounter: Payer: Self-pay | Admitting: Gastroenterology

## 2021-08-15 IMAGING — CR DG HIP (WITH OR WITHOUT PELVIS) 2-3V*R*
2 series · 2 of 2 positions shown · non-contrast
Comparison: None.

CLINICAL DATA: Low back and right hip pain

EXAM:
DG HIP (WITH OR WITHOUT PELVIS) 2-3V RIGHT

[t hip ap right]
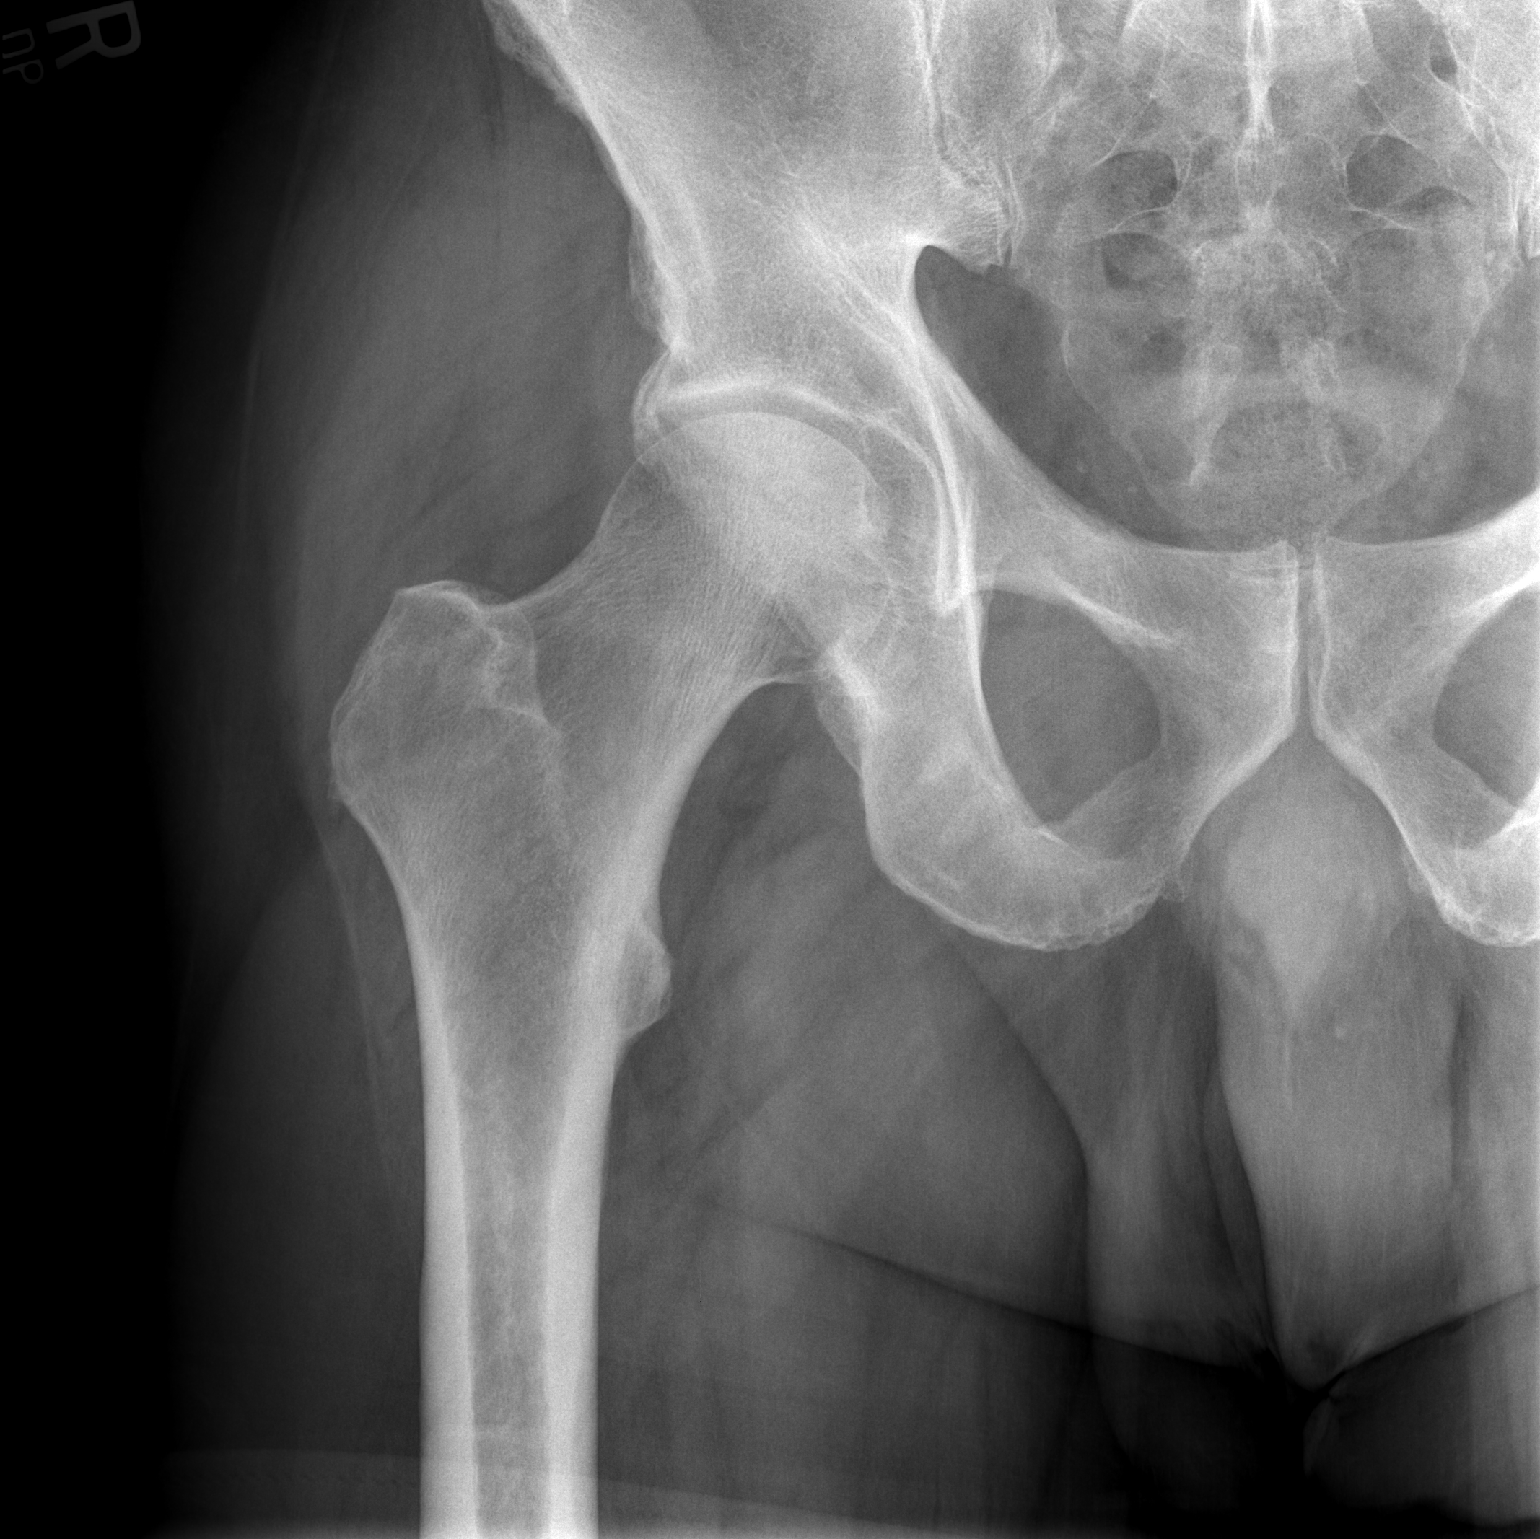

[t hip frog leg right]
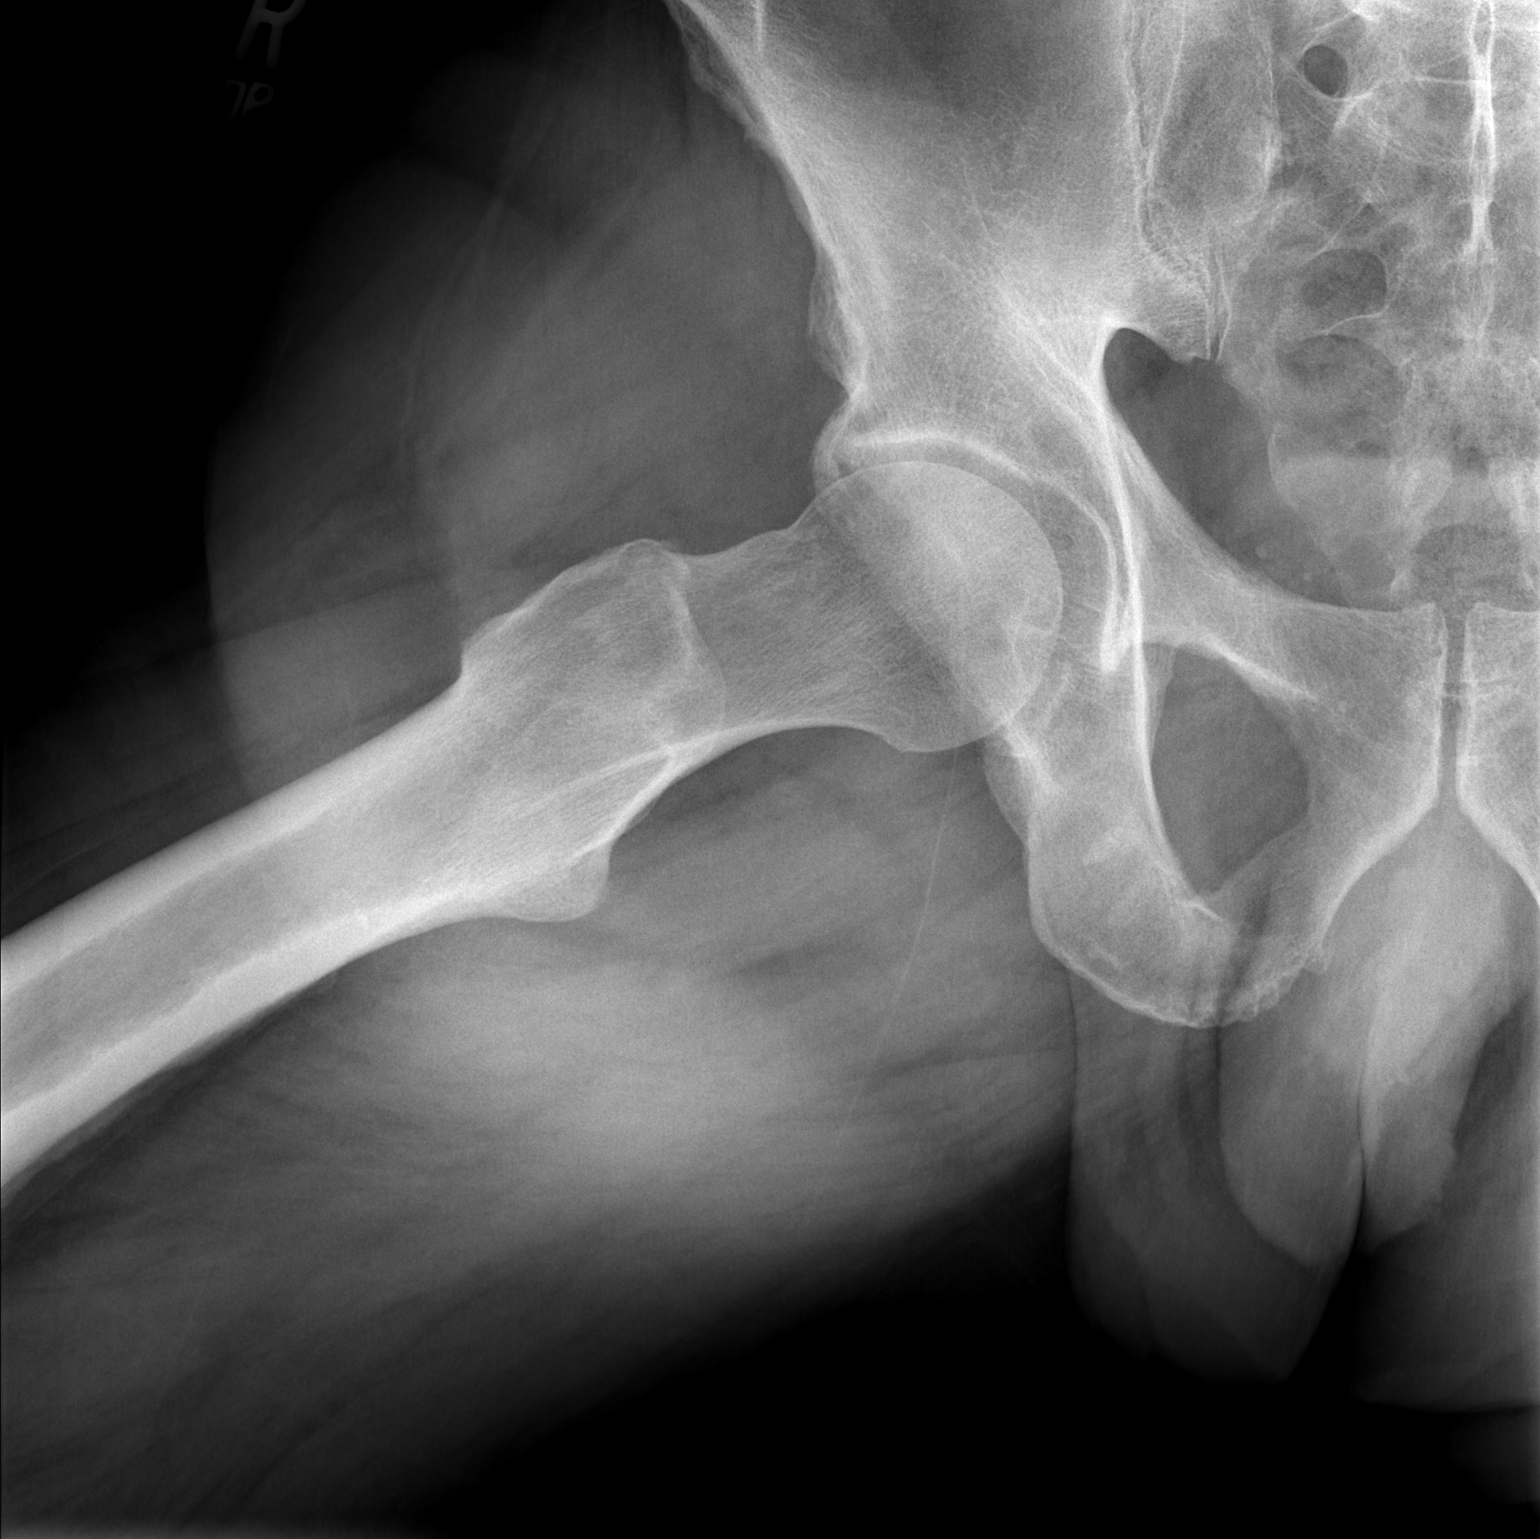

[2 of 2 positions shown; findings below may reference images not displayed]

FINDINGS: Frontal and frogleg lateral views of the right hip are obtained. No
fracture, subluxation, or dislocation. There is mild osteoarthritis
with osteophyte formation along the superolateral aspect of the
acetabulum. Mild superior joint space narrowing. Soft tissues are
normal.
IMPRESSION: 1. Mild right hip osteoarthritis.

## 2021-08-15 IMAGING — CR DG LUMBAR SPINE 2-3V
3 series · 3 of 3 positions shown · non-contrast
Comparison: None.

CLINICAL DATA: Low back pain, right hip pain

EXAM:
LUMBAR SPINE - 2-3 VIEW

[t l-spine a.p.]
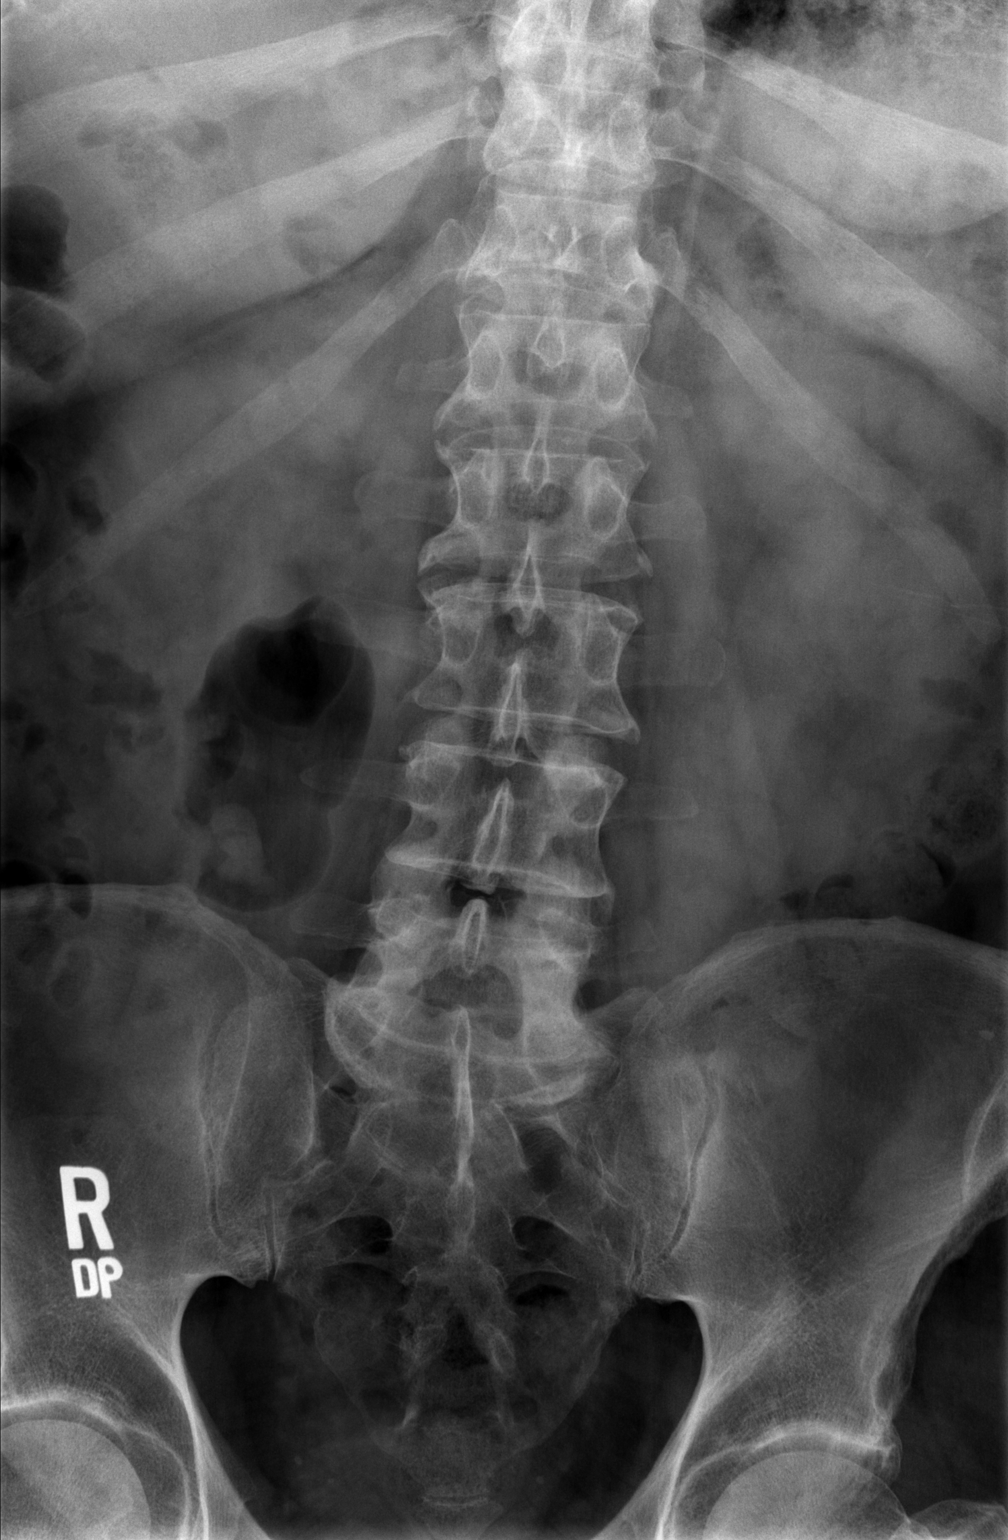

[t l-spine lat]
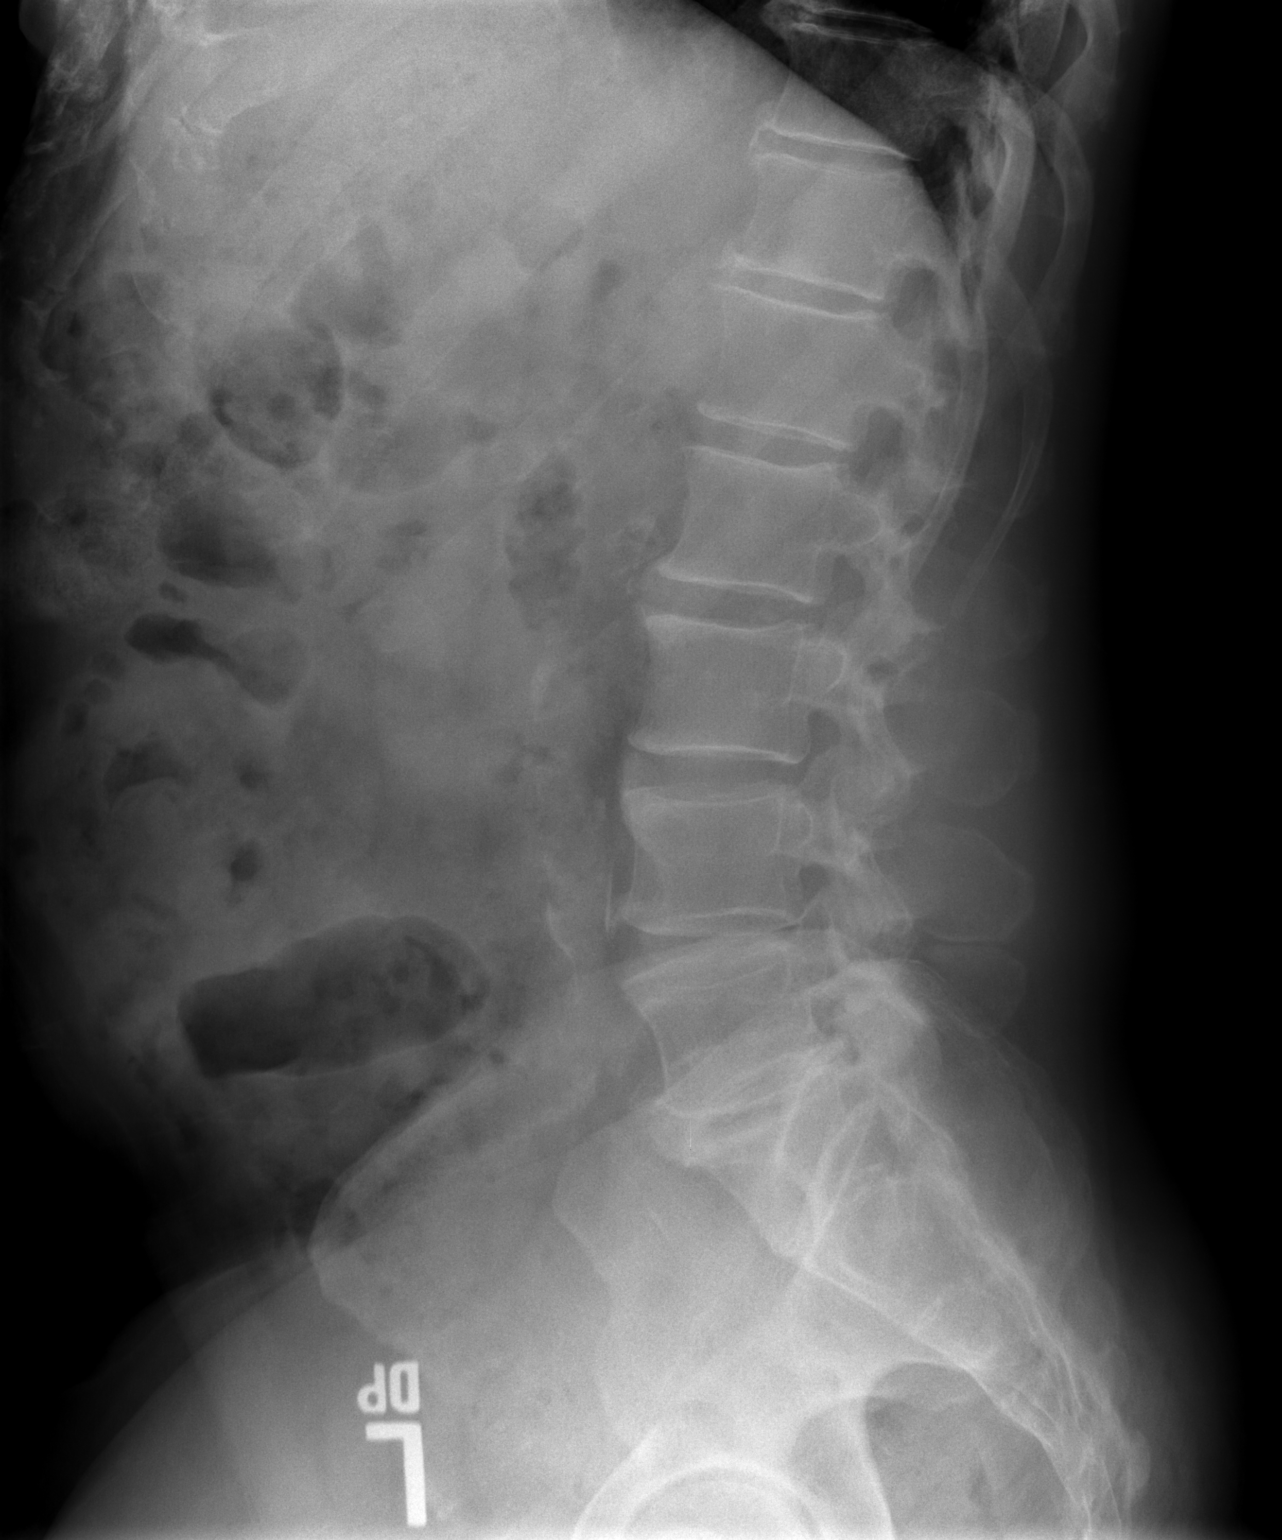

[t l-spine l5-s1 spot]
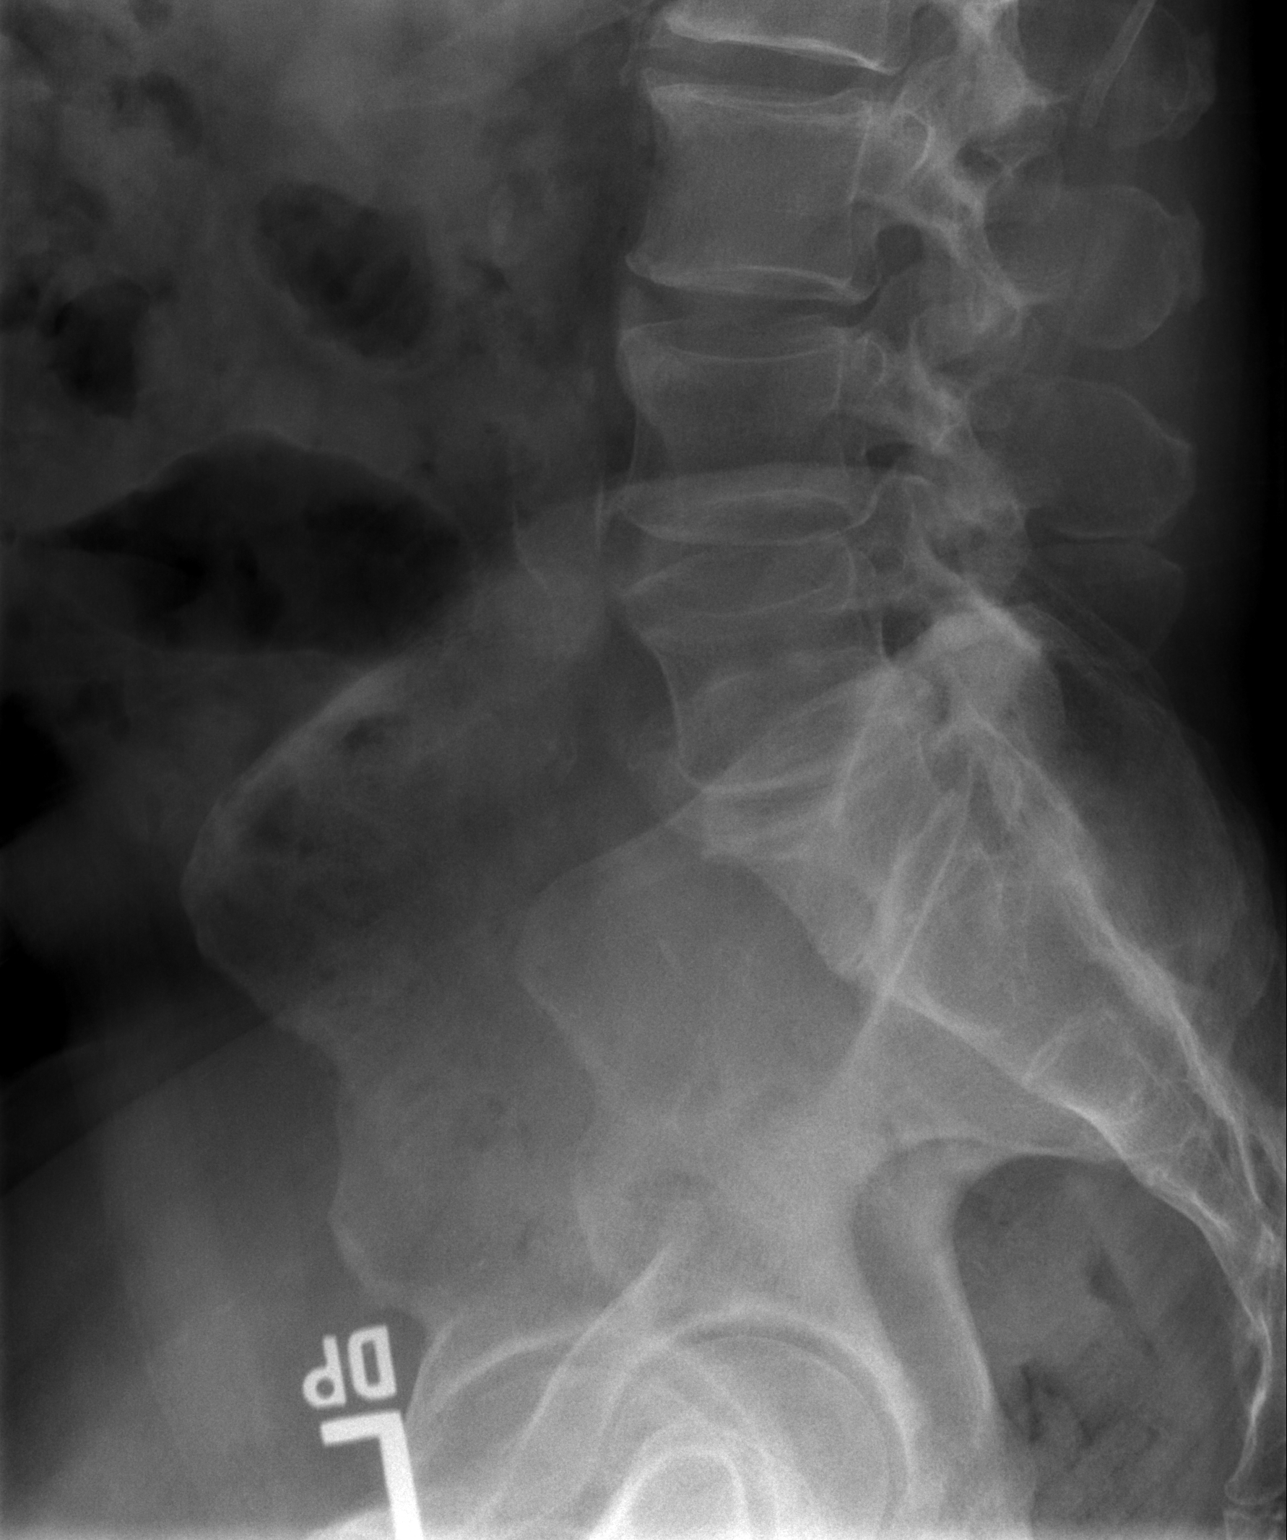

[3 of 3 positions shown; findings below may reference images not displayed]

FINDINGS: Frontal and lateral views of the lumbar spine demonstrate 5
non-rib-bearing lumbar type vertebral bodies with mild left convex
curvature centered at L3. No acute fractures. Mild to moderate
spondylosis at L4-5 and to a greater extent L5-S1, with associated
facet hypertrophy. Sacroiliac joints are normal.
IMPRESSION: 1. Prominent spondylosis and facet hypertrophy greatest at L5/S1.

## 2021-08-21 ENCOUNTER — Encounter: Payer: Self-pay | Admitting: Gastroenterology

## 2021-09-30 ENCOUNTER — Ambulatory Visit (AMBULATORY_SURGERY_CENTER): Payer: PRIVATE HEALTH INSURANCE | Admitting: *Deleted

## 2021-09-30 ENCOUNTER — Other Ambulatory Visit: Payer: Self-pay

## 2021-09-30 VITALS — Ht 73.0 in | Wt 227.0 lb

## 2021-09-30 DIAGNOSIS — Z86010 Personal history of colonic polyps: Secondary | ICD-10-CM

## 2021-09-30 MED ORDER — NA SULFATE-K SULFATE-MG SULF 17.5-3.13-1.6 GM/177ML PO SOLN: 1.0000 | ORAL | 0 refills | Status: DC

## 2021-09-30 MED ORDER — PLENVU 140 G PO SOLR: 1.0000 | Freq: Once | ORAL | 0 refills | Status: AC

## 2021-09-30 NOTE — Progress Notes (Signed)
Patient's pre-visit was done today over the phone with the patient. Name,DOB and address verified. Patient denies any allergies to Eggs and Soy. Patient denies any problems with anesthesia/sedation. Patient is not taking any diet pills or blood thinners. No home Oxygen.   Prep instructions sent to pt's MyChart-pt aware. Patient understands to call us back with any questions or concerns. Patient is aware of our care-partner policy and [id number] safety protocol. Pt request Plenvu prep.  EMMI education assigned to the patient for the procedure, sent to [place].   The patient is COVID-19 vaccinated.

## 2021-10-12 ENCOUNTER — Encounter: Payer: Self-pay | Admitting: Gastroenterology

## 2021-10-14 ENCOUNTER — Other Ambulatory Visit: Payer: Self-pay

## 2021-10-14 ENCOUNTER — Encounter: Payer: Self-pay | Admitting: Gastroenterology

## 2021-10-14 ENCOUNTER — Ambulatory Visit (AMBULATORY_SURGERY_CENTER): Payer: PRIVATE HEALTH INSURANCE | Admitting: Gastroenterology

## 2021-10-14 VITALS — BP 136/76 | HR 60 | Temp 98.6°F | Resp 20 | Ht 73.0 in | Wt 227.0 lb

## 2021-10-14 DIAGNOSIS — K635 Polyp of colon: Secondary | ICD-10-CM

## 2021-10-14 DIAGNOSIS — D125 Benign neoplasm of sigmoid colon: Secondary | ICD-10-CM

## 2021-10-14 DIAGNOSIS — Z86010 Personal history of colonic polyps: Secondary | ICD-10-CM

## 2021-10-14 MED ORDER — SODIUM CHLORIDE 0.9 % IV SOLN: 500.0000 mL | INTRAVENOUS | Status: DC

## 2021-10-14 NOTE — Progress Notes (Signed)
Called to room to assist during endoscopic procedure.  Patient ID and intended procedure confirmed with present staff. Received instructions for my participation in the procedure from the performing physician.  

## 2021-10-14 NOTE — Patient Instructions (Signed)
Please read handouts provided. Continue present medications. Await pathology results.   YOU HAD AN ENDOSCOPIC PROCEDURE TODAY AT THE [facility]:   Refer to the procedure report that was given to you for any specific questions about what was found during the examination.  If the procedure report does not answer your questions, please call your gastroenterologist to clarify.  If you requested that your care partner not be given the details of your procedure findings, then the procedure report has been included in a sealed envelope for you to review at your convenience later.  YOU SHOULD EXPECT: Some feelings of bloating in the abdomen. Passage of more gas than usual.  Walking can help get rid of the air that was put into your GI tract during the procedure and reduce the bloating. If you had a lower endoscopy (such as a colonoscopy or flexible sigmoidoscopy) you may notice spotting of blood in your stool or on the toilet paper. If you underwent a bowel prep for your procedure, you may not have a normal bowel movement for a few days.  Please Note:  You might notice some irritation and congestion in your nose or some drainage.  This is from the oxygen used during your procedure.  There is no need for concern and it should clear up in a day or so.  SYMPTOMS TO REPORT IMMEDIATELY:  Following lower endoscopy (colonoscopy or flexible sigmoidoscopy):  Excessive amounts of blood in the stool  Significant tenderness or worsening of abdominal pains  Swelling of the abdomen that is new, acute  Fever of 100F or higher   For urgent or emergent issues, a gastroenterologist can be reached at any hour by calling [phone]. Do not use MyChart messaging for urgent concerns.    DIET:  We do recommend a small meal at first, but then you may proceed to your regular diet.  Drink plenty of fluids but you should avoid alcoholic beverages for 24 hours.  ACTIVITY:  You should plan to take it easy  for the rest of today and you should NOT DRIVE or use heavy machinery until tomorrow (because of the sedation medicines used during the test).    FOLLOW UP: Our staff will call the number listed on your records 48-72 hours following your procedure to check on you and address any questions or concerns that you may have regarding the information given to you following your procedure. If we do not reach you, we will leave a message.  We will attempt to reach you two times.  During this call, we will ask if you have developed any symptoms of COVID 19. If you develop any symptoms (ie: fever, flu-like symptoms, shortness of breath, cough etc.) before then, please call [phone].  If you test positive for Covid 19 in the 2 weeks post procedure, please call and report this information to us.    If any biopsies were taken you will be contacted by phone or by letter within the next 1-3 weeks.  Please call us at [phone] if you have not heard about the biopsies in 3 weeks.    SIGNATURES/CONFIDENTIALITY: You and/or your care partner have signed paperwork which will be entered into your electronic medical record.  These signatures attest to the fact that that the information above on your After Visit Summary has been reviewed and is understood.  Full responsibility of the confidentiality of this discharge information lies with you and/or your care-partner.  

## 2021-10-14 NOTE — Progress Notes (Signed)
Report to PACU, RN, vss, BBS= Clear.  

## 2021-10-14 NOTE — Progress Notes (Signed)
? ?History & Physical ? ?Primary Care Physician:  Bonnita Nasuti, MD ?Primary Gastroenterologist: Lucio Edward, MD ? ?CHIEF COMPLAINT:  Personal history of colon polyps  ? ?HPI: Jason Forbes is a 61 y.o. male with a personal history of multiple sessile serrated polyps and tubular adenomatous polyps on colonoscopy in 2019 for colonoscopy today. ? ? ?Past Medical History:  ?Diagnosis Date  ? Allergy   ? GERD (gastroesophageal reflux disease)   ? Hyperlipidemia   ? Hypertension   ? PONV (postoperative nausea and vomiting)   ? r/t morphine  ? ? ?Past Surgical History:  ?Procedure Laterality Date  ? BACK SURGERY    ? Back surgery x 3  ? CARPAL TUNNEL RELEASE Right 08/17/2018  ? Procedure: RIGHT CARPAL TUNNEL RELEASE;  Surgeon: Daryll Brod, MD;  Location: [facility];  Service: Orthopedics;  Laterality: Right;  ? COLONOSCOPY  11/07/2017  ? Dr.Johni Narine  ? HAND TENDON SURGERY Right   ? Hand surgery x#  ? POLYPECTOMY    ? ? ?Prior to Admission medications   ?Medication Sig Start Date End Date Taking? Authorizing Provider  ?cetirizine (ZYRTEC) 10 MG tablet Take 10 mg by mouth daily.   Yes [provider]  ?Cholecalciferol (D3 ADULT PO) Take by mouth.   Yes [provider]  ?HYDROcodone-acetaminophen (NORCO) 10-325 MG tablet Take 1 tablet by mouth every 6 (six) hours as needed. Takes half of 10 mg. As needed.   Yes [provider]  ?HYDROcodone-acetaminophen (NORCO) 5-325 MG tablet Take 1 tablet by mouth every 6 (six) hours as needed. 08/17/18  Yes Daryll Brod, MD  ?Multiple Vitamin (MULTIVITAMIN) tablet Take 1 tablet by mouth daily.   Yes [provider]  ?Olmesartan-amLODIPine-HCTZ 40-5-25 MG TABS Take 40 mg by mouth daily.   Yes [provider]  ?Omega-3 Fatty Acids (FISH OIL OMEGA-3 PO) Take by mouth.   Yes [provider]  ?omeprazole (PRILOSEC) 20 MG capsule Take 20 mg by mouth daily.   Yes [provider]  ?rosuvastatin (CRESTOR) 20 MG tablet Take  20 mg by mouth daily.   Yes [provider]  ?traZODone (DESYREL) 50 MG tablet Take 50 mg by mouth at bedtime.   Yes [provider]  ?[place] Patient needs an Allard left AFO for his left foot drop. ?Patient not taking: Reported on 10/14/2021 06/12/20   Thurman Coyer, DO  ? ? ?Current Outpatient Medications  ?Medication Sig Dispense Refill  ? cetirizine (ZYRTEC) 10 MG tablet Take 10 mg by mouth daily.    ? Cholecalciferol (D3 ADULT PO) Take by mouth.    ? HYDROcodone-acetaminophen (NORCO) 10-325 MG tablet Take 1 tablet by mouth every 6 (six) hours as needed. Takes half of 10 mg. As needed.    ? HYDROcodone-acetaminophen (NORCO) 5-325 MG tablet Take 1 tablet by mouth every 6 (six) hours as needed. 20 tablet 0  ? Multiple Vitamin (MULTIVITAMIN) tablet Take 1 tablet by mouth daily.    ? Olmesartan-amLODIPine-HCTZ 40-5-25 MG TABS Take 40 mg by mouth daily.    ? Omega-3 Fatty Acids (FISH OIL OMEGA-3 PO) Take by mouth.    ? omeprazole (PRILOSEC) 20 MG capsule Take 20 mg by mouth daily.    ? rosuvastatin (CRESTOR) 20 MG tablet Take 20 mg by mouth daily.    ? traZODone (DESYREL) 50 MG tablet Take 50 mg by mouth at bedtime.    ? AMBULATORY NON FORMULARY MEDICATION Patient needs an Allard left AFO for  his left foot drop. (Patient not taking: Reported on 10/14/2021) 1 Device 0  ? ?No current facility-administered medications for this visit.  ? ? ?Allergies as of 10/14/2021 - Review Complete 10/14/2021  ?Allergen Reaction Noted  ? Morphine and related Nausea Only 10/24/2017  ? ? ?Family History  ?Problem Relation Age of Onset  ? Colon cancer Neg Hx   ? Esophageal cancer Neg Hx   ? Liver cancer Neg Hx   ? Pancreatic cancer Neg Hx   ? Rectal cancer Neg Hx   ? Stomach cancer Neg Hx   ? Colon polyps Neg Hx   ? ? ?Social History  ? ?Socioeconomic History  ? Marital status: Legally Separated  ?  Spouse name: Not on file  ? Number of children: Not on file  ? Years of education: Not on  file  ? Highest education level: Not on file  ?Occupational History  ? Not on file  ?Tobacco Use  ? Smoking status: Former  ?  Types: Cigarettes  ?  Quit date: 2007  ?  Years since quitting: 16.1  ? Smokeless tobacco: Former  ?Vaping Use  ? Vaping Use: Never used  ?Substance and Sexual Activity  ? Alcohol use: Yes  ?  Alcohol/week: 5.0 standard drinks  ?  Types: 5 Standard drinks or equivalent per week  ?  Comment: socially   ? Drug use: Never  ? Sexual activity: Not on file  ?Other Topics Concern  ? Not on file  ?Social History Narrative  ? Not on file  ? ?Social Determinants of Health  ? ?Financial Resource Strain: Not on file  ?Food Insecurity: Not on file  ?Transportation Needs: Not on file  ?Physical Activity: Not on file  ?Stress: Not on file  ?Social Connections: Not on file  ?Intimate Partner Violence: Not on file  ? ? ?Review of Systems: ? ?All systems reviewed an negative except where noted in HPI. ? ?Gen: Denies any fever, chills, sweats, anorexia, fatigue, weakness, malaise, weight loss, and sleep disorder ?CV: Denies chest pain, angina, palpitations, syncope, orthopnea, PND, peripheral edema, and claudication. ?Resp: Denies dyspnea at rest, dyspnea with exercise, cough, sputum, wheezing, coughing up blood, and pleurisy. ?GI: Denies vomiting blood, jaundice, and fecal incontinence.   Denies dysphagia or odynophagia. ?GU : Denies urinary burning, blood in urine, urinary frequency, urinary hesitancy, nocturnal urination, and urinary incontinence. ?MS: Denies joint pain, limitation of movement, and swelling, stiffness, low back pain, extremity pain. Denies muscle weakness, cramps, atrophy.  ?Derm: Denies rash, itching, dry skin, hives, moles, warts, or unhealing ulcers.  ?Psych: Denies depression, anxiety, memory loss, suicidal ideation, hallucinations, paranoia, and confusion. ?Heme: Denies bruising, bleeding, and enlarged lymph nodes. ?Neuro:  Denies any headaches, dizziness, paresthesias. ?Endo:  Denies  any problems with DM, thyroid, adrenal function. ? ? ?Physical Exam: ?General:  Alert, well-developed, in NAD ?Head:  Normocephalic and atraumatic. ?Eyes:  Sclera clear, no icterus.   Conjunctiva pink. ?Ears:  Normal auditory acuity. ?Mouth:  No deformity or lesions.  ?Neck:  Supple; no masses . ?Lungs:  Clear throughout to auscultation.   No wheezes, crackles, or rhonchi. No acute distress. ?Heart:  Regular rate and rhythm; no murmurs. ?Abdomen:  Soft, nondistended, nontender. No masses, hepatomegaly. No obvious masses.  Normal bowel .    ?Rectal:  Deferred   ?Msk:  Symmetrical without gross deformities.Marland Kitchen ?Pulses:  Normal pulses noted. ?Extremities:  Without edema. ?Neurologic:  Alert and  oriented x4;  grossly normal neurologically. ?Skin:  Intact without  significant lesions or rashes. ?Cervical Nodes:  No significant cervical adenopathy. ?Psych:  Alert and cooperative. Normal mood and affect. ? ? ?Impression / Plan:  ? ?Personal history of multiple sessile serrated polyps and tubular adenomatous polyps on colonoscopy in 2019 for colonoscopy today. ? ?Aine Strycharz T. Fuller Plan  10/14/2021, 8:59 AM ?See Shea Evans, [facility], to contact our on call provider ? ? ?  ?  ?

## 2021-10-14 NOTE — Op Note (Signed)
[facility] ?Patient Name: Jason Forbes ?Procedure Date: 10/14/2021 8:27 AM ?MRN: [id number] ?Endoscopist: Ladene Artist , MD ?Age: 61 ?Referring MD:  ?Date of Birth: 1961/01/17 ?Gender: Male ?Account #: [id number] ?Procedure:                Colonoscopy ?Indications:              Surveillance: Personal history of adenomatous  ?                          polyps on last colonoscopy > 3 years ago ?Medicines:                Monitored Anesthesia Care ?Procedure:                Pre-Anesthesia Assessment: ?                          - Prior to the procedure, a History and Physical  ?                          was performed, and patient medications and  ?                          allergies were reviewed. The patient's tolerance of  ?                          previous anesthesia was also reviewed. The risks  ?                          and benefits of the procedure and the sedation  ?                          options and risks were discussed with the patient.  ?                          All questions were answered, and informed consent  ?                          was obtained. Prior Anticoagulants: The patient has  ?                          taken no previous anticoagulant or antiplatelet  ?                          agents. ASA Grade Assessment: II - A patient with  ?                          mild systemic disease. After reviewing the risks  ?                          and benefits, the patient was deemed in  ?                          satisfactory condition to undergo the procedure. ?  After obtaining informed consent, the colonoscope  ?                          was passed under direct vision. Throughout the  ?                          procedure, the patient's blood pressure, pulse, and  ?                          oxygen saturations were monitored continuously. The  ?                          Colonoscope was introduced through the anus and  ?                          advanced to the the cecum,  identified by  ?                          appendiceal orifice and ileocecal valve. The  ?                          ileocecal valve, appendiceal orifice, and rectum  ?                          were photographed. The quality of the bowel  ?                          preparation was excellent. The colonoscopy was  ?                          performed without difficulty. The patient tolerated  ?                          the procedure well. ?Scope In: 8:35:45 AM ?Scope Out: 8:48:39 AM ?Scope Withdrawal Time: 0 hours 9 minutes 33 seconds  ?Total Procedure Duration: 0 hours 12 minutes 54 seconds  ?Findings:                 The perianal and digital rectal examinations were  ?                          normal. ?                          Two sessile polyps were found in the sigmoid colon.  ?                          The polyps were 5 to 7 mm in size. These polyps  ?                          were removed with a cold snare. Resection and  ?                          retrieval were complete. ?  Internal hemorrhoids were found during  ?                          retroflexion. The hemorrhoids were small and Grade  ?                          I (internal hemorrhoids that do not prolapse). ?                          The exam was otherwise without abnormality on  ?                          direct and retroflexion views. ?Complications:            No immediate complications. Estimated blood loss:  ?                          None. ?Estimated Blood Loss:     Estimated blood loss: none. ?Impression:               - Two 5 to 7 mm polyps in the sigmoid colon,  ?                          removed with a cold snare. Resected and retrieved. ?                          - Internal hemorrhoids. ?                          - The examination was otherwise normal on direct  ?                          and retroflexion views. ?Recommendation:           - Repeat colonoscopy after studies are complete for  ?                           surveillance based on pathology results. ?                          - Patient has a contact number available for  ?                          emergencies. The signs and symptoms of potential  ?                          delayed complications were discussed with the  ?                          patient. Return to normal activities tomorrow.  ?                          Written discharge instructions were provided to the  ?                          patient. ?                          -  Resume previous diet. ?                          - Continue present medications. ?                          - Await pathology results. ?Ladene Artist, MD ?10/14/2021 8:50:46 AM ?This report has been signed electronically. ?

## 2021-10-15 ENCOUNTER — Telehealth: Payer: Self-pay | Admitting: Gastroenterology

## 2021-10-15 NOTE — Telephone Encounter (Signed)
Returned pt's call. States he had chills throughout the night las night. States he checked his temperature yesterday and it was 99.5. He checked it again this morning and it was 101.4 and later 101.5. States he has just checked it again a few minutes ago and it was 102.2. Also c/o body aches and sore throat. Pt denies abdominal pain or any GI symptoms like nausea, vomiting, or diarrhea. Pt had a routine colonoscopy her procedure result. Pt states he has not taken a Covid test. Pt asked if he could take Tylenol for the fever. RN instructed that he could take Tylenol every six hours as needed, but to not exceed 4000mg  in a 24 hour period. Also instructed pt that he could alternate with ibuprofen/motrin if needed for the fever. Pt states he will call his PCP to be seen and possibly tested for Covid or the flu.  ?

## 2021-10-15 NOTE — Telephone Encounter (Signed)
Patient called and stated that he had a procedure done yesterday and last night and day he has felt bad and has a fever of 101.5 with body aches. Seeking advice if this is normal. Please advise.  ?

## 2021-10-16 ENCOUNTER — Telehealth: Payer: Self-pay

## 2021-10-16 NOTE — Telephone Encounter (Signed)
?  Follow up Call- ? ?Call back number 10/14/2021  ?Post procedure Call Back phone  # [phone]  ?Permission to leave phone message Yes  ?Some recent data might be hidden  ?  ? ?Patient questions: ? ?Do you have a fever, pain , or abdominal swelling? Yes.   ?Pain Score  0 * ? ?Have you tolerated food without any problems? Yes.   ? ?Have you been able to return to your normal activities? No. ? ?Do you have any questions about your discharge instructions: ?Diet   No. ?Medications  No. ?Follow up visit  No. ? ?Do you have questions or concerns about your Care? No. ? ?Actions: ?* If pain score is 4 or above: ?No action needed, pain <4. ?Patient states he has been running a fever, called [place] as instructed. Went to his PCP and was swabbed for FLU and COvid. Pending results. ? ? ?

## 2021-10-16 NOTE — Telephone Encounter (Signed)
First post procedure follow up call, no answer 

## 2021-11-02 ENCOUNTER — Encounter: Payer: Self-pay | Admitting: Gastroenterology

## 2022-08-19 ENCOUNTER — Other Ambulatory Visit: Payer: Self-pay | Admitting: Orthopaedic Surgery

## 2022-08-19 DIAGNOSIS — M5416 Radiculopathy, lumbar region: Secondary | ICD-10-CM

## 2022-09-08 ENCOUNTER — Ambulatory Visit
Admission: RE | Admit: 2022-09-08 | Discharge: 2022-09-08 | Disposition: A | Discharge status: Home / Self Care | Payer: Managed Care, Other (non HMO) | Source: Ambulatory Visit | Attending: Orthopaedic Surgery | Admitting: Orthopaedic Surgery

## 2022-09-08 DIAGNOSIS — M5416 Radiculopathy, lumbar region: Secondary | ICD-10-CM

## 2022-09-08 MED ORDER — GADOPICLENOL 0.5 MMOL/ML IV SOLN
10.0000 mL | Freq: Once | INTRAVENOUS | Status: AC | PRN
  Administered 2022-09-08: 10 mL via INTRAVENOUS

## 2022-11-25 DIAGNOSIS — E785 Hyperlipidemia, unspecified: Secondary | ICD-10-CM | POA: Insufficient documentation

## 2022-11-25 DIAGNOSIS — T7840XA Allergy, unspecified, initial encounter: Secondary | ICD-10-CM | POA: Insufficient documentation

## 2022-11-25 DIAGNOSIS — K219 Gastro-esophageal reflux disease without esophagitis: Secondary | ICD-10-CM | POA: Insufficient documentation

## 2022-11-25 DIAGNOSIS — I10 Essential (primary) hypertension: Secondary | ICD-10-CM | POA: Insufficient documentation

## 2022-11-25 DIAGNOSIS — Z9889 Other specified postprocedural states: Secondary | ICD-10-CM | POA: Insufficient documentation

## 2022-11-30 ENCOUNTER — Ambulatory Visit: Payer: Managed Care, Other (non HMO) | Admitting: Cardiology

## 2023-01-03 NOTE — Progress Notes (Signed)
Cardiology Office Note:    Date:  01/04/2023   ID:  Jason Forbes, DOB May 05, 1961, MRN [id number]  PCP:  Galvin Proffer, MD  Cardiologist:  Norman Herrlich, MD   Referring MD: Galvin Proffer, MD  ASSESSMENT:    1. SOB (shortness of breath) on exertion   2. Primary hypertension   3. Mixed hyperlipidemia    PLAN:    In order of problems listed above:  He has multiple cardiovascular risk hypertension hyperlipidemia and a stable pattern of exertional shortness of breath?  Anginal equivalent referred for cardiac CTA will also guide [place] regarding the need for lipid-lowering therapy his calcium score is 0 ongoing treatment with a statin becomes optional Well-controlled advised to get a validated blood pressure cuff good technique trend record blood pressures bring to the next visit For now continue his high intensity statin  Next appointment 3 months   Medication Adjustments/Labs and Tests Ordered: Current medicines are reviewed at length with the patient today.  Concerns regarding medicines are outlined above.  No orders of the defined types were placed in this encounter.  No orders of the defined types were placed in this encounter.    I am concerned regarding CAD with exertional shortness of breath  History of Present Illness:    Jason Forbes is a 62 y.o. male who is being seen today for the evaluation of hypertension at the request of Hague, Imran P, MD.  He was seen by me at [facility] cardiology 05/12/2016 with a history of hypertension and hyperlipdemia  He is here to see me today because he is concerned regarding CAD he has a pattern of exertional shortness of breath when he does physical effort he can slow down and walk through it that may well be anginal equivalent and is concerned he needs an ischemia evaluation.  He inquires regarding cardiac CTA He is not having chest pain palpitation or syncope Blood pressure runs 1 20-1 30 systolic but unfortunately is using a  nonvalidated wrist cuff and he will purchase an upper extremity Omron device for accurate measurements I looked at his EKG from March when he had back surgery sinus rhythm and normal recent laboratory studies 10/05/2022 shows a normal CBC hemoglobin 14.1 CMP with a creatinine 1.14 GFR 73 cc potassium 4.1 normal liver function test hemoglobin A1c 5.5 and lipid profile cholesterol 170 LDL 93 triglycerides 174 non-HDL cholesterol 128 HDL 42 He tolerates his statin without muscle pain or weakness Past Medical History:  Diagnosis Date   Allergy    GERD (gastroesophageal reflux disease)    Hyperlipidemia    Hypertension    PONV (postoperative nausea and vomiting)    r/t morphine    Past Surgical History:  Procedure Laterality Date   BACK SURGERY     Back surgery x 3   CARPAL TUNNEL RELEASE Right 08/17/2018   Procedure: RIGHT CARPAL TUNNEL RELEASE;  Surgeon: Cindee Salt, MD;  Location: [facility];  Service: Orthopedics;  Laterality: Right;   COLONOSCOPY  11/07/2017   Dr.Stark   HAND TENDON SURGERY Right    Hand surgery x#   POLYPECTOMY      Current Medications: Current Meds  Medication Sig   cetirizine (ZYRTEC) 10 MG tablet Take 10 mg by mouth daily.   Cholecalciferol (D3 ADULT PO) Take by mouth.   HYDROcodone-acetaminophen (NORCO) 10-325 MG tablet Take 1 tablet by mouth every 6 (six) hours as needed. Takes half of 10 mg. As needed.  Multiple Vitamin (MULTIVITAMIN) tablet Take 1 tablet by mouth daily.   Olmesartan-amLODIPine-HCTZ 40-5-25 MG TABS Take 40 mg by mouth daily.   Omega-3 Fatty Acids (FISH OIL OMEGA-3 PO) Take by mouth.   omeprazole (PRILOSEC) 20 MG capsule Take 20 mg by mouth daily.   rosuvastatin (CRESTOR) 20 MG tablet Take 20 mg by mouth daily.   traZODone (DESYREL) 50 MG tablet Take 50 mg by mouth at bedtime.     Allergies:   Morphine and codeine, Spironolactone, and Morphine   Social History   Socioeconomic History   Marital status: Legally  Separated    Spouse name: Not on file   Number of children: Not on file   Years of education: Not on file   Highest education level: Not on file  Occupational History   Not on file  Tobacco Use   Smoking status: Former    Types: Cigarettes    Quit date: 2007    Years since quitting: 17.3   Smokeless tobacco: Former  Building services engineer Use: Never used  Substance and Sexual Activity   Alcohol use: Yes    Alcohol/week: 5.0 standard drinks of alcohol    Types: 5 Standard drinks or equivalent per week    Comment: socially    Drug use: Never   Sexual activity: Not on file  Other Topics Concern   Not on file  Social History Narrative   Not on file   Social Determinants of Health   Financial Resource Strain: Not on file  Food Insecurity: Not on file  Transportation Needs: Not on file  Physical Activity: Not on file  Stress: Not on file  Social Connections: Unknown (11/07/2017)   Social Connection and Isolation Panel [NHANES]    Frequency of Communication with Friends and Family: Patient declined    Frequency of Social Gatherings with Friends and Family: Patient declined    Attends Religious Services: Patient declined    Database administrator or Organizations: Patient declined    Attends Banker Meetings: Patient declined    Marital Status: Patient declined     Family History: The patient's family history is negative for Colon cancer, Esophageal cancer, Liver cancer, Pancreatic cancer, Rectal cancer, Stomach cancer, and Colon polyps.  ROS:   ROS Please see the history of present illness.     All other systems reviewed and are negative.  EKGs/Labs/Other Studies Reviewed:    The following studies were reviewed today: See history  Physical Exam:    VS:  BP 122/80 (BP Location: Right Arm, Patient Position: Sitting)   Pulse 90   Ht 6\' 1"  (1.854 m)   Wt 232 lb (105.2 kg)   SpO2 96%   BMI 30.61 kg/m     Wt Readings from Last 3 Encounters:  01/04/23 232  lb (105.2 kg)  10/14/21 227 lb (103 kg)  09/30/21 227 lb (103 kg)     GEN:  Well nourished, well developed in no acute distress HEENT: Normal NECK: No JVD; No carotid bruits LYMPHATICS: No lymphadenopathy CARDIAC: RRR, no murmurs, rubs, gallops RESPIRATORY:  Clear to auscultation without rales, wheezing or rhonchi  ABDOMEN: Soft, non-tender, non-distended MUSCULOSKELETAL:  No edema; No deformity  SKIN: Warm and dry NEUROLOGIC:  Alert and oriented x 3 PSYCHIATRIC:  Normal affect     Signed, Norman Herrlich, MD  01/04/2023 9:06 AM    [facility]

## 2023-01-04 ENCOUNTER — Encounter: Payer: Self-pay | Admitting: Cardiology

## 2023-01-04 ENCOUNTER — Ambulatory Visit: Payer: Managed Care, Other (non HMO) | Attending: Cardiology | Admitting: Cardiology

## 2023-01-04 VITALS — BP 122/80 | HR 90 | Ht 73.0 in | Wt 232.0 lb

## 2023-01-04 DIAGNOSIS — R072 Precordial pain: Secondary | ICD-10-CM | POA: Diagnosis not present

## 2023-01-04 DIAGNOSIS — R0602 Shortness of breath: Secondary | ICD-10-CM | POA: Diagnosis not present

## 2023-01-04 DIAGNOSIS — E782 Mixed hyperlipidemia: Secondary | ICD-10-CM | POA: Diagnosis not present

## 2023-01-04 DIAGNOSIS — I10 Essential (primary) hypertension: Secondary | ICD-10-CM

## 2023-01-04 MED ORDER — METOPROLOL TARTRATE 100 MG PO TABS: 100.0000 mg | ORAL_TABLET | Freq: Once | ORAL | 0 refills | Status: DC

## 2023-01-04 NOTE — Patient Instructions (Addendum)
Medication Instructions:  Your physician recommends that you continue on your current medications as directed. Please refer to the Current Medication list given to you today.  *If you need a refill on your cardiac medications before your next appointment, please call your pharmacy*   Lab Work: Your physician recommends that you return for lab work in:   Labs 1 week before CT: BMP  If you have labs (blood work) drawn today and your tests are completely normal, you will receive your results only by: MyChart Message (if you have MyChart) OR A paper copy in the mail If you have any lab test that is abnormal or we need to change your treatment, we will call you to review the results.   Testing/Procedures:  Dr. Dulce Sellar has ordered a vascular screening for you.     Your cardiac CT will be scheduled at one of the below locations:   [facility] [address] [place], [place] [address] [phone]  OR  [facility] [address] [place], [place] [address] [phone]  OR   [facility] [address] [place], [place] [address] [phone]  If scheduled at [facility], please arrive at the [facility] and Children's Entrance (Entrance C2) of [facility] 30 minutes prior to test start time. You can use the FREE valet parking offered at entrance C (encouraged to control the heart rate for the test)  Proceed to the [facility] Radiology Department (first floor) to check-in and test prep.  All radiology patients and guests should use entrance C2 at [facility], accessed from [facility], even though the hospital's physical address listed is [address].    If scheduled at [facility] or [facility], please arrive 15 mins early for check-in and test prep.   Please follow these instructions carefully  (unless otherwise directed):  On the Night Before the Test: Be sure to Drink plenty of water. Do not consume any caffeinated/decaffeinated beverages or chocolate 12 hours prior to your test. Do not take any antihistamines 12 hours prior to your test.  On the Day of the Test: Drink plenty of water until 1 hour prior to the test. Do not eat any food 1 hour prior to test. You may take your regular medications prior to the test.  Take metoprolol (Lopressor) two hours prior to test. If you take Olmesartan/Amlodipine/Hydrochlorothiazide combination medication please HOLD on the morning of the test.      After the Test: Drink plenty of water. After receiving IV contrast, you may experience a mild flushed feeling. This is normal. On occasion, you may experience a mild rash up to 24 hours after the test. This is not dangerous. If this occurs, you can take Benadryl 25 mg and increase your fluid intake. If you experience trouble breathing, this can be serious. If it is severe call 911 IMMEDIATELY. If it is mild, please call our office. If you take any of these medications: Glipizide/Metformin, Avandament, Glucavance, please do not take 48 hours after completing test unless otherwise instructed.  We will call to schedule your test 2-4 weeks out understanding that some insurance companies will need an authorization prior to the service being performed.   For non-scheduling related questions, please contact the cardiac imaging nurse navigator should you have any questions/concerns: Rockwell Alexandria, Cardiac Imaging Nurse Navigator Larey Brick, Cardiac Imaging Nurse Navigator [facility]  Direct Office Dial: [phone]   For scheduling needs, including cancellations and rescheduling, please call [place], [phone].    Follow-Up: At [facility], you and your health needs are our priority.  As part of our continuing mission to provide you with exceptional  heart care, we have created designated Provider Care Teams.  These Care Teams include your primary Cardiologist (physician) and Advanced Practice Providers (APPs -  Physician Assistants and Nurse Practitioners) who all work together to provide you with the care you need, when you need it.  We recommend signing up for the patient portal called "MyChart".  Sign up information is provided on this After Visit Summary.  MyChart is used to connect with patients for Virtual Visits (Telemedicine).  Patients are able to view lab/test results, encounter notes, upcoming appointments, etc.  Non-urgent messages can be sent to your provider as well.   To learn more about what you can do with MyChart, go to [email or web address].    Your next appointment:   3 month(s)  Provider:   Norman Herrlich, MD    Other Instructions Get an upper arm Omron home blood pressure cuff       Healthbeat  Tips to measure your blood pressure correctly  To determine whether you have hypertension, a medical professional will take a blood pressure reading. How you prepare for the test, the position of your arm, and other factors can change a blood pressure reading by 10% or more. That could be enough to hide high blood pressure, start you on a drug you don't really need, or lead your doctor to incorrectly adjust your medications. National and international guidelines offer specific instructions for measuring blood pressure. If a doctor, nurse, or medical assistant isn't doing it right, don't hesitate to ask him or her to get with the guidelines. Here's what you can do to ensure a correct reading:  Don't drink a caffeinated beverage or smoke during the 30 minutes before the test.  Sit quietly for five minutes before the test begins.  During the measurement, sit in a chair with your feet on the floor and your arm supported so your elbow is at about heart level.  The inflatable part of the cuff should completely cover at least  80% of your upper arm, and the cuff should be placed on bare skin, not over a shirt.  Don't talk during the measurement.  Have your blood pressure measured twice, with a brief break in between. If the readings are different by 5 points or more, have it done a third time. There are times to break these rules. If you sometimes feel lightheaded when getting out of bed in the morning or when you stand after sitting, you should have your blood pressure checked while seated and then while standing to see if it falls from one position to the next. Because blood pressure varies throughout the day, your doctor will rarely diagnose hypertension on the basis of a single reading. Instead, he or she will want to confirm the measurements on at least two occasions, usually within a few weeks of one another. The exception to this rule is if you have a blood pressure reading of 180/110 mm Hg or higher. A result this high usually calls for prompt treatment. It's also a good idea to have your blood pressure measured in both arms at least once, since the reading in one arm (usually the right) may be higher than that in the left. A 2014 study in The American  Journal of Medicine of nearly 3,400 people found average arm- to-arm differences in systolic blood pressure of about 5 points. The higher number should be used to make treatment decisions. In 2017, new guidelines from the American Heart Association, the [employer] of Cardiology, and nine other health organizations lowered the diagnosis of high blood pressure to 130/80 mm Hg or higher for all adults. The guidelines also redefined the various blood pressure categories to now include normal, elevated, Stage 1 hypertension, Stage 2 hypertension, and hypertensive crisis (see "Blood pressure categories"). Blood pressure categories  Blood pressure category SYSTOLIC (upper number)  DIASTOLIC (lower number)  Normal Less than 120 mm Hg and Less than 80 mm Hg  Elevated  120-129 mm Hg and Less than 80 mm Hg  High blood pressure: Stage 1 hypertension 130-139 mm Hg or 80-89 mm Hg  High blood pressure: Stage 2 hypertension 140 mm Hg or higher or 90 mm Hg or higher  Hypertensive crisis (consult your doctor immediately) Higher than 180 mm Hg and/or Higher than 120 mm Hg  Source: American Heart Association and American Stroke Association. For more on getting your blood pressure under control, buy Controlling Your Blood Pressure, a Special Health Report from [facility].

## 2023-01-04 NOTE — Addendum Note (Signed)
Addended by: Roxanne Mins I on: 01/04/2023 09:56 AM   Modules accepted: Orders

## 2023-01-06 LAB — BASIC METABOLIC PANEL
BUN/Creatinine Ratio: 23 (ref 10–24)
BUN: 24 mg/dL (ref 8–27)
CO2: 23 mmol/L (ref 20–29)
Calcium: 9.5 mg/dL (ref 8.6–10.2)
Chloride: 101 mmol/L (ref 96–106)
Creatinine, Ser: 1.05 mg/dL (ref 0.76–1.27)
Glucose: 110 mg/dL — ABNORMAL HIGH (ref 70–99)
Potassium: 4.4 mmol/L (ref 3.5–5.2)
Sodium: 140 mmol/L (ref 134–144)
eGFR: 81 mL/min/{1.73_m2} (ref >59)

## 2023-01-13 ENCOUNTER — Telehealth (HOSPITAL_COMMUNITY): Payer: Self-pay | Admitting: *Deleted

## 2023-01-13 ENCOUNTER — Telehealth (HOSPITAL_COMMUNITY): Payer: Self-pay | Admitting: Emergency Medicine

## 2023-01-13 NOTE — Telephone Encounter (Signed)
Patient returning call about his upcoming cardiac imaging study; pt verbalizes understanding of appt date/time, parking situation and where to check in, pre-test NPO status and medications ordered, and verified current allergies; name and call back number provided for further questions should they arise  Larey Brick RN Navigator Cardiac Imaging [facility] [phone] office [phone] cell  Patient to hold his daily BP medication and take 100mg  metoprolol tartrate two hours prior to his cardiac CT scan. He is aware to arrive at 11am.

## 2023-01-13 NOTE — Telephone Encounter (Signed)
Attempted to call patient regarding upcoming cardiac CT appointment. °Left message on voicemail with name and callback number °Sahian Kerney RN Navigator Cardiac Imaging °[facility] °[phone] Office °[phone] Cell ° °

## 2023-01-14 ENCOUNTER — Ambulatory Visit (HOSPITAL_COMMUNITY)
Admission: RE | Admit: 2023-01-14 | Discharge: 2023-01-14 | Disposition: A | Discharge status: Home / Self Care | Payer: Managed Care, Other (non HMO) | Source: Ambulatory Visit | Attending: Cardiology | Admitting: Cardiology

## 2023-01-14 ENCOUNTER — Other Ambulatory Visit: Payer: Self-pay | Admitting: Cardiology

## 2023-01-14 ENCOUNTER — Telehealth: Payer: Self-pay | Admitting: Cardiology

## 2023-01-14 ENCOUNTER — Ambulatory Visit (HOSPITAL_BASED_OUTPATIENT_CLINIC_OR_DEPARTMENT_OTHER)
Admission: RE | Admit: 2023-01-14 | Discharge: 2023-01-14 | Disposition: A | Discharge status: Home / Self Care | Payer: Managed Care, Other (non HMO) | Source: Ambulatory Visit | Attending: Cardiology | Admitting: Cardiology

## 2023-01-14 DIAGNOSIS — R931 Abnormal findings on diagnostic imaging of heart and coronary circulation: Secondary | ICD-10-CM

## 2023-01-14 DIAGNOSIS — I2510 Atherosclerotic heart disease of native coronary artery without angina pectoris: Secondary | ICD-10-CM

## 2023-01-14 DIAGNOSIS — R072 Precordial pain: Secondary | ICD-10-CM | POA: Insufficient documentation

## 2023-01-14 MED ORDER — NITROGLYCERIN 0.4 MG SL SUBL
SUBLINGUAL_TABLET | SUBLINGUAL | Status: AC
  Filled 2023-01-14: qty 2

## 2023-01-14 MED ORDER — IOHEXOL 350 MG/ML SOLN
100.0000 mL | Freq: Once | INTRAVENOUS | Status: AC | PRN
  Administered 2023-01-14: 100 mL via INTRAVENOUS

## 2023-01-14 MED ORDER — NITROGLYCERIN 0.4 MG SL SUBL
0.8000 mg | SUBLINGUAL_TABLET | Freq: Once | SUBLINGUAL | Status: AC
  Administered 2023-01-14: 0.8 mg via SUBLINGUAL

## 2023-01-14 NOTE — Telephone Encounter (Signed)
Spoke with patient and let him know that Dr. Dulce Sellar has been in clinic and has not done a result note on his test he had done today.  I informed him as soon as Dr. Dulce Sellar resulted it the nurse would call him and let him know his recommendations.  He thanked me for the call and had no additional questions.

## 2023-01-14 NOTE — Telephone Encounter (Signed)
Patient is calling to discuss results of test. Please advise.

## 2023-01-18 ENCOUNTER — Telehealth: Payer: Self-pay

## 2023-01-18 DIAGNOSIS — I2510 Atherosclerotic heart disease of native coronary artery without angina pectoris: Secondary | ICD-10-CM | POA: Insufficient documentation

## 2023-01-18 DIAGNOSIS — E782 Mixed hyperlipidemia: Secondary | ICD-10-CM

## 2023-01-18 HISTORY — DX: Atherosclerotic heart disease of native coronary artery without angina pectoris: I25.10

## 2023-01-18 NOTE — Telephone Encounter (Signed)
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

## 2023-01-18 NOTE — Telephone Encounter (Signed)
Viewed in MyChart Routed to PCP Labs ordered

## 2023-01-19 LAB — LIPID PANEL
Chol/HDL Ratio: 4.8 ratio (ref 0.0–5.0)
Cholesterol, Total: 184 mg/dL (ref 100–199)
HDL: 38 mg/dL — ABNORMAL LOW (ref >39)
LDL Chol Calc (NIH): 110 mg/dL — ABNORMAL HIGH (ref 0–99)
Triglycerides: 205 mg/dL — ABNORMAL HIGH (ref 0–149)
VLDL Cholesterol Cal: 36 mg/dL (ref 5–40)

## 2023-01-19 LAB — LIPOPROTEIN A (LPA): Lipoprotein (a): 28.9 nmol/L (ref <75.0)

## 2023-01-20 ENCOUNTER — Telehealth: Payer: Self-pay

## 2023-01-20 ENCOUNTER — Other Ambulatory Visit: Payer: Self-pay

## 2023-01-20 DIAGNOSIS — E782 Mixed hyperlipidemia: Secondary | ICD-10-CM

## 2023-01-20 NOTE — Telephone Encounter (Signed)
Called patient and informed him of the results of his lab work. Patient stated that he had been cutting his Crestor in half instead of taking a full dose. Spoke to Dr. Dulce Sellar and he recommended having him start taking his full dose of Crestor and in 2 months to have him return for a lab draw to evaluate his lipids. I relayed this information to the patient and he was agreeable with this plan and had no further questions at this time.

## 2023-02-01 ENCOUNTER — Ambulatory Visit: Payer: Managed Care, Other (non HMO) | Attending: Cardiology

## 2023-03-02 ENCOUNTER — Ambulatory Visit: Payer: Managed Care, Other (non HMO) | Attending: Cardiology

## 2023-03-02 DIAGNOSIS — R072 Precordial pain: Secondary | ICD-10-CM

## 2023-03-02 DIAGNOSIS — I10 Essential (primary) hypertension: Secondary | ICD-10-CM

## 2023-03-02 DIAGNOSIS — R0602 Shortness of breath: Secondary | ICD-10-CM

## 2023-03-02 DIAGNOSIS — E782 Mixed hyperlipidemia: Secondary | ICD-10-CM

## 2023-03-03 ENCOUNTER — Telehealth: Payer: Self-pay

## 2023-03-03 NOTE — Telephone Encounter (Signed)
Patient notified through my chart.

## 2023-03-03 NOTE — Telephone Encounter (Signed)
-----   Message from Norman Herrlich sent at 03/03/2023  1:37 PM EDT ----- Good result we can review in detail at his next visit

## 2023-03-07 ENCOUNTER — Encounter (HOSPITAL_COMMUNITY): Payer: Self-pay | Admitting: Cardiology

## 2023-04-06 ENCOUNTER — Telehealth: Payer: Self-pay

## 2023-04-06 ENCOUNTER — Other Ambulatory Visit: Payer: Self-pay | Admitting: *Deleted

## 2023-04-06 ENCOUNTER — Ambulatory Visit: Payer: Managed Care, Other (non HMO) | Attending: Cardiology | Admitting: Cardiology

## 2023-04-06 ENCOUNTER — Encounter: Payer: Self-pay | Admitting: Cardiology

## 2023-04-06 VITALS — BP 127/78 | HR 78 | Ht 73.0 in | Wt 235.2 lb

## 2023-04-06 DIAGNOSIS — E782 Mixed hyperlipidemia: Secondary | ICD-10-CM | POA: Diagnosis not present

## 2023-04-06 DIAGNOSIS — I2510 Atherosclerotic heart disease of native coronary artery without angina pectoris: Secondary | ICD-10-CM

## 2023-04-06 DIAGNOSIS — R931 Abnormal findings on diagnostic imaging of heart and coronary circulation: Secondary | ICD-10-CM | POA: Diagnosis not present

## 2023-04-06 MED ORDER — REPATHA 140 MG/ML ~~LOC~~ SOSY: 1.0000 mL | PREFILLED_SYRINGE | SUBCUTANEOUS | 2 refills | Status: DC

## 2023-04-06 NOTE — Telephone Encounter (Signed)
Receieved fax from [facility] stating patient needs a prior authorization completed for his Repatha that was prescribed to him.  I will forward request to prior auth team.

## 2023-04-06 NOTE — Patient Instructions (Addendum)
Medication Instructions:  Your physician has recommended you make the following change in your medication:  Start Repatha 140 mg injection once every 2 weeks Start Aspirin 81 mg coated tablets once daily  *If you need a refill on your cardiac medications before your next appointment, please call your pharmacy*   Lab Work: Your physician recommends that you return for lab work in: 2 Months Fasting Lipid and CMP Lab opens at 8am. You DO NOT NEED an appointment. Best time to come is between 8am and 12noon and between 1:30 and 4:30. If you have been asked to fast for your blood work please have nothing to eat or drink after midnight. You may have water.   If you have labs (blood work) drawn today and your tests are completely normal, you will receive your results only by: MyChart Message (if you have MyChart) OR A paper copy in the mail If you have any lab test that is abnormal or we need to change your treatment, we will call you to review the results.   Testing/Procedures: NONE   Follow-Up: At [facility], you and your health needs are our priority.  As part of our continuing mission to provide you with exceptional heart care, we have created designated Provider Care Teams.  These Care Teams include your primary Cardiologist (physician) and Advanced Practice Providers (APPs -  Physician Assistants and Nurse Practitioners) who all work together to provide you with the care you need, when you need it.  We recommend signing up for the patient portal called "MyChart".  Sign up information is provided on this After Visit Summary.  MyChart is used to connect with patients for Virtual Visits (Telemedicine).  Patients are able to view lab/test results, encounter notes, upcoming appointments, etc.  Non-urgent messages can be sent to your provider as well.   To learn more about what you can do with MyChart, go to [email or web address].    Your next appointment:   6 month(s)  Provider:    Norman Herrlich, MD    Other Instructions Check and record BP twice daily for 3 weeks   Healthbeat  Tips to measure your blood pressure correctly  To determine whether you have hypertension, a medical professional will take a blood pressure reading. How you prepare for the test, the position of your arm, and other factors can change a blood pressure reading by 10% or more. That could be enough to hide high blood pressure, start you on a drug you don't really need, or lead your doctor to incorrectly adjust your medications. National and international guidelines offer specific instructions for measuring blood pressure. If a doctor, nurse, or medical assistant isn't doing it right, don't hesitate to ask him or her to get with the guidelines. Here's what you can do to ensure a correct reading:  Don't drink a caffeinated beverage or smoke during the 30 minutes before the test.  Sit quietly for five minutes before the test begins.  During the measurement, sit in a chair with your feet on the floor and your arm supported so your elbow is at about heart level.  The inflatable part of the cuff should completely cover at least 80% of your upper arm, and the cuff should be placed on bare skin, not over a shirt.  Don't talk during the measurement.  Have your blood pressure measured twice, with a brief break in between. If the readings are different by 5 points or more, have it done a third time. There  are times to break these rules. If you sometimes feel lightheaded when getting out of bed in the morning or when you stand after sitting, you should have your blood pressure checked while seated and then while standing to see if it falls from one position to the next. Because blood pressure varies throughout the day, your doctor will rarely diagnose hypertension on the basis of a single reading. Instead, he or she will want to confirm the measurements on at least two occasions, usually within a few weeks of one  another. The exception to this rule is if you have a blood pressure reading of 180/110 mm Hg or higher. A result this high usually calls for prompt treatment. It's also a good idea to have your blood pressure measured in both arms at least once, since the reading in one arm (usually the right) may be higher than that in the left. A 2014 study in The American Journal of Medicine of nearly 3,400 people found average arm- to-arm differences in systolic blood pressure of about 5 points. The higher number should be used to make treatment decisions. In 2017, new guidelines from the American Heart Association, the [employer] of Cardiology, and nine other health organizations lowered the diagnosis of high blood pressure to 130/80 mm Hg or higher for all adults. The guidelines also redefined the various blood pressure categories to now include normal, elevated, Stage 1 hypertension, Stage 2 hypertension, and hypertensive crisis (see "Blood pressure categories"). Blood pressure categories  Blood pressure category SYSTOLIC (upper number)  DIASTOLIC (lower number)  Normal Less than 120 mm Hg and Less than 80 mm Hg  Elevated 120-129 mm Hg and Less than 80 mm Hg  High blood pressure: Stage 1 hypertension 130-139 mm Hg or 80-89 mm Hg  High blood pressure: Stage 2 hypertension 140 mm Hg or higher or 90 mm Hg or higher  Hypertensive crisis (consult your doctor immediately) Higher than 180 mm Hg and/or Higher than 120 mm Hg  Source: American Heart Association and American Stroke Association. For more on getting your blood pressure under control, buy Controlling Your Blood Pressure, a Special Health Report from [facility].

## 2023-04-06 NOTE — Progress Notes (Signed)
Cardiology Office Note:    Date:  04/06/2023   ID:  DWYANE PRUETTE, DOB [birth year], MRN [id number]  PCP:  Galvin Proffer, MD  Cardiologist:  Norman Herrlich, MD    Referring MD: Galvin Proffer, MD    ASSESSMENT:    1. Coronary artery disease involving native heart, unspecified vessel or lesion type, unspecified whether angina present   2. Agatston coronary artery calcium score greater than 400    PLAN:    In order of problems listed above:  As suspected he has CAD fortunately does not have severe flow-limiting proximal LAD or left main stenosis He will enhance medical therapy to achieve LDL target of 55 with a PCSK9 inhibitor along with his high intensity statin initiate aspirin 81 mg daily and continue his antihypertensives Check record and bring blood pressure determinations to the office with him Lipid profile 2 months   Next appointment: 6 months   Medication Adjustments/Labs and Tests Ordered: Current medicines are reviewed at length with the patient today.  Concerns regarding medicines are outlined above.  Orders Placed This Encounter  Procedures   EKG 12-Lead   No orders of the defined types were placed in this encounter.    History of Present Illness:    Jason Forbes is a 62 y.o. male with a hx of hypertension hyperlipidemia and exertional shortness of breath last seen 01/04/23.  Recent cardiac CTA showed score of 623/90-second percentile plaque volume 80th percentile mild stenosis of the LAD marginal and moderate stenosis in the mid right coronary artery.  FFR profile was normal.  Compliance with diet, lifestyle and medications: Yes  We reviewed the results of his coronary CTA with him His LDL remains significantly elevated he is going out of PCSK9 inhibitor his LP(a) is normal follow-up labs 2 months He tolerates his statin without muscle pain or weakness He is not having angina no further exertional shortness of breath palpitation or syncope Past Medical  History:  Diagnosis Date   Allergy    GERD (gastroesophageal reflux disease)    Hyperlipidemia    Hypertension    PONV (postoperative nausea and vomiting)    r/t morphine    Current Medications: Current Meds  Medication Sig   ALPRAZolam (XANAX) 0.5 MG tablet Take 0.5 mg by mouth as needed for anxiety.   cetirizine (ZYRTEC) 10 MG tablet Take 10 mg by mouth daily.   Cholecalciferol (D 1000) 25 MCG (1000 UT) capsule Take 1,000 Units by mouth daily.   cyclobenzaprine (FLEXERIL) 10 MG tablet Take 10 mg by mouth as needed for muscle spasms.   meclizine (ANTIVERT) 25 MG tablet Take 25 mg by mouth as needed for dizziness or nausea.   Multiple Vitamin (MULTIVITAMIN) tablet Take 1 tablet by mouth daily.   olmesartan-hydrochlorothiazide (BENICAR HCT) 40-25 MG tablet Take 1 tablet by mouth daily.   Omega-3 Fatty Acids (FISH OIL OMEGA-3 PO) Take by mouth.   omeprazole (PRILOSEC) 20 MG capsule Take 20 mg by mouth daily.   rosuvastatin (CRESTOR) 20 MG tablet Take 20 mg by mouth daily.   traZODone (DESYREL) 100 MG tablet Take 100 mg by mouth at bedtime as needed for sleep.      EKGs/Labs/Other Studies Reviewed:    The following studies were reviewed today:  Cardiac Studies & Procedures          CT SCANS  CT CORONARY MORPH W/CTA COR W/SCORE 01/14/2023  Addendum 01/20/2023  1:01 PM ADDENDUM REPORT: 01/20/2023 12:58  EXAM: OVER-READ INTERPRETATION  CT CHEST  The following report is an over-read performed by radiologist Dr. Narda Rutherford of [facility] on 01/20/2023. This over-read does not include interpretation of cardiac or coronary anatomy or pathology. The coronary CTA interpretation by the cardiologist is attached.  COMPARISON:  None.  FINDINGS: Vascular: The included aorta is normal in caliber.  Mediastinum/nodes: No adenopathy or mass. Unremarkable esophagus.  Lungs: No focal airspace disease. No pulmonary nodule. No pleural fluid. The included airways are  patent.  Upper abdomen: No acute findings. 12 mm cyst in the left lobe of the liver, needs no further imaging follow-up.  Musculoskeletal: There are no acute or suspicious osseous abnormalities. Thoracic spondylosis with anterior spurring.  IMPRESSION: No acute or unexpected extracardiac findings.   Electronically Signed By: Narda Rutherford M.D. On: 01/20/2023 12:58  Narrative CLINICAL DATA:  62 yo male with dyspnea  EXAM: Cardiac/Coronary CTA  TECHNIQUE: A non-contrast, gated CT scan was obtained with axial slices of 3 mm through the heart for calcium scoring. Calcium scoring was performed using the Agatston method. A 120 kV prospective, gated, contrast cardiac scan was obtained. Gantry rotation speed was 250 msecs and collimation was 0.6 mm. Two sublingual nitroglycerin tablets (0.8 mg) were given. The 3D data set was reconstructed in 5% intervals of the 35-75% of the R-R cycle. Diastolic phases were analyzed on a dedicated workstation using MPR, MIP, and VRT modes. The patient received 95 cc of contrast.  FINDINGS: Image quality: Good.  Noise artifact is: Limited.  Coronary Arteries:  Normal coronary origin.  Right dominance.  Left main: The left main is a large caliber vessel with a normal take off from the left coronary cusp that trifurcates into a LAD, LCX, and ramus intermedius. There is no plaque or stenosis.  Left anterior descending artery: The LAD has mild (25-49) calcified plaque in the proximal, mid and distal vessel. The LAD gives off 2 small patent diagonal branches.  Ramus intermedius: Small, patent with no evidence of plaque or stenosis.  Left circumflex artery: The LCX is non-dominant and patent with minimal (0-24) calcified plaque. The LCX gives off large, branching OM1 (minimal plaque in the proximal vessel and mild (25-49) in the mid vessel); small OM2.  Right coronary artery: The RCA is dominant with normal take off from the right  coronary cusp. There is mild (25-49) soft plaque in the proximal vessel followed by minimal plaque followed by moderate (50-69) mixed plaque stenosis in the mid vessel; mild (25-49) calcified plaque in the distal vessel. The RCA terminates as a PDA and right posterolateral branch without evidence of plaque or stenosis.  Right Atrium: Right atrial size is within normal limits.  Right Ventricle: The right ventricular cavity is within normal limits.  Left Atrium: Left atrial size is normal in size with no left atrial appendage filling defect.  Left Ventricle: The ventricular cavity size is within normal limits.  Pulmonary arteries: Normal in size.  Pulmonary veins: Normal pulmonary venous drainage.  Pericardium: Normal thickness without significant effusion or calcium present.  Cardiac valves: The aortic valve is trileaflet without significant calcification. The mitral valve is normal without significant calcification.  Aorta: Normal caliber with aortic atherosclerosis.  Extra-cardiac findings: See attached radiology report for non-cardiac structures.  IMPRESSION: 1. Coronary calcium score of 623. This was 69 percentile for age-, sex, and race-matched controls.  2. Total plaque volume 911 mm3 which is 80 percentile for age- and sex-matched controls (calcified plaque 130 mm3; non-calcified plaque 781 mm3). TPV is (  extensive).  3. Normal coronary origin with right dominance.  4. Mild stenoses in the LAD, OM; moderate (50-69) stenosis in the mid RCA.  5. Aortic atherosclerosis.  6. Study will be sent for FFR.  RECOMMENDATIONS: CAD-RADS 3: Moderate stenosis. Consider symptom-guided anti-ischemic pharmacotherapy as well as risk factor modification per guideline directed care. Additional analysis with CT FFR will be submitted.  Olga Millers, MD  Electronically Signed: By: Olga Millers M.D. On: 01/14/2023 13:13              Recent Labs: 01/05/2023: BUN 24;  Creatinine, Ser 1.05; Potassium 4.4; Sodium 140  Recent Lipid Panel    Component Value Date/Time   CHOL 184 01/18/2023 1145   TRIG 205 (H) 01/18/2023 1145   HDL 38 (L) 01/18/2023 1145   CHOLHDL 4.8 01/18/2023 1145   LDLCALC 110 (H) 01/18/2023 1145    Physical Exam:    VS:  BP 127/78   Pulse 78   Ht 6\' 1"  (1.854 m)   Wt 235 lb 3.2 oz (106.7 kg)   SpO2 94%   BMI 31.03 kg/m     Wt Readings from Last 3 Encounters:  04/06/23 235 lb 3.2 oz (106.7 kg)  01/04/23 232 lb (105.2 kg)  10/14/21 227 lb (103 kg)     GEN:  Well nourished, well developed in no acute distress HEENT: Normal NECK: No JVD; No carotid bruits LYMPHATICS: No lymphadenopathy CARDIAC: RRR, no murmurs, rubs, gallops RESPIRATORY:  Clear to auscultation without rales, wheezing or rhonchi  ABDOMEN: Soft, non-tender, non-distended MUSCULOSKELETAL:  No edema; No deformity  SKIN: Warm and dry NEUROLOGIC:  Alert and oriented x 3 PSYCHIATRIC:  Normal affect    Signed, Norman Herrlich, MD  04/06/2023 9:39 AM    [facility]

## 2023-04-07 ENCOUNTER — Other Ambulatory Visit (HOSPITAL_COMMUNITY): Payer: Self-pay

## 2023-04-07 ENCOUNTER — Telehealth: Payer: Self-pay | Admitting: Pharmacy Technician

## 2023-04-07 NOTE — Telephone Encounter (Signed)
PA request has been Submitted. New Encounter created for follow up. For additional info see Pharmacy Prior Auth telephone encounter from 04/07/2023.

## 2023-04-07 NOTE — Telephone Encounter (Signed)
Pharmacy Patient Advocate Encounter   Received notification from Pt Calls Messages that prior authorization for Repatha 140MG /ML syringes is required/requested.   Insurance verification completed.   The patient is insured through [facility] .   Per test claim: PA required; PA submitted to [facility] via CoverMyMeds Key/confirmation #/EOC [id number] Status is pending

## 2023-04-11 NOTE — Telephone Encounter (Signed)
 Pharmacy Patient Advocate Encounter  Received notification from [facility] that Prior Authorization for REPATHA has been DENIED. Please advise how you'd like to proceed. Full denial letter will be uploaded to the media tab. See denial reason below.  MUST TRY ZETIA FIRST

## 2023-04-15 ENCOUNTER — Other Ambulatory Visit: Payer: Self-pay

## 2023-04-15 DIAGNOSIS — E782 Mixed hyperlipidemia: Secondary | ICD-10-CM

## 2023-04-15 MED ORDER — EZETIMIBE 10 MG PO TABS: 10.0000 mg | ORAL_TABLET | Freq: Every day | ORAL | 3 refills | Status: DC

## 2023-04-15 NOTE — Telephone Encounter (Signed)
Called patient and informed him of Dr. Hulen Shouts recommendation below:  "Lets start Zetia 10 mg daily 2 months to a lipid profile and if not ideal we will add Repatha"  Patient was agreeable with starting Zetia and had no further questions at this time. Zetia medication was ordered via Epic and sent to the patient's pharmacy. Labs were ordered via Epic.

## 2023-06-23 LAB — LIPID PANEL
Chol/HDL Ratio: 2.9 {ratio} (ref 0.0–5.0)
Cholesterol, Total: 129 mg/dL (ref 100–199)
HDL: 45 mg/dL (ref >39)
LDL Chol Calc (NIH): 64 mg/dL (ref 0–99)
Triglycerides: 108 mg/dL (ref 0–149)
VLDL Cholesterol Cal: 20 mg/dL (ref 5–40)

## 2023-06-23 NOTE — Progress Notes (Signed)
Cardiology Office Note:  .   Date:  06/24/2023  ID:  Jason Forbes, DOB [birth year], MRN [id number] PCP: Galvin Proffer, MD  [facility] Providers Cardiologist:  None    History of Present Illness: .   Jason Forbes is a 62 y.o. male with a past medical history of hypertension, nonobstructive CAD per coronary CTA, GERD, dyslipidemia, obesity.  03/02/2023 Vascuscreen right carotid with mild plaque, left carotid with mild plaque.  ABIs were negative.  Aorta without evidence of aneurysm but diffuse atherosclerosis noted 01/14/2023 coronary CTA calcium score 623, 92nd percentile, FFR was negative 01/16/2021 echo EF 60 to 65%, borderline LVH, trace TR  Most recently evaluated by Dr. Dulce Sellar on 04/06/2023, he was started on aspirin, advised to keep a check of his blood pressure.  He presents today for follow-up of nonobstructive CAD and hypertension.  He has been keeping a blood pressure log, checks his blood pressure pretty frequently throughout the day, log reveals that most of the time his blood pressure is well-controlled, he does have elevated readings.  His blood pressure is controlled in the office today and he took his blood pressure medicine approximately an hour before he arrived.  He has been trying to lose weight however, has only lost about 10 pounds and is unable to lose anymore.  He has tried various diets as well as exercising, is very concerned about the deleterious effects this may have on his cardiovascular health in the future if he cannot get his weight under better control.  He is watching what he eats, eating smaller portions, just cannot seem to get his metabolism to move.  He denies chest pain, palpitations, dyspnea, pnd, orthopnea, n, v, dizziness, syncope, edema, weight gain, or early satiety.   ROS: Review of Systems  All other systems reviewed and are negative.    Studies Reviewed: .        Cardiac Studies & Procedures          CT SCANS  CT CORONARY MORPH W/CTA COR  W/SCORE 01/14/2023  Addendum 01/20/2023  1:01 PM ADDENDUM REPORT: 01/20/2023 12:58  EXAM: OVER-READ INTERPRETATION  CT CHEST  The following report is an over-read performed by radiologist Dr. Narda Rutherford of [facility] on 01/20/2023. This over-read does not include interpretation of cardiac or coronary anatomy or pathology. The coronary CTA interpretation by the cardiologist is attached.  COMPARISON:  None.  FINDINGS: Vascular: The included aorta is normal in caliber.  Mediastinum/nodes: No adenopathy or mass. Unremarkable esophagus.  Lungs: No focal airspace disease. No pulmonary nodule. No pleural fluid. The included airways are patent.  Upper abdomen: No acute findings. 12 mm cyst in the left lobe of the liver, needs no further imaging follow-up.  Musculoskeletal: There are no acute or suspicious osseous abnormalities. Thoracic spondylosis with anterior spurring.  IMPRESSION: No acute or unexpected extracardiac findings.   Electronically Signed By: Narda Rutherford M.D. On: 01/20/2023 12:58  Narrative CLINICAL DATA:  62 yo male with dyspnea  EXAM: Cardiac/Coronary CTA  TECHNIQUE: A non-contrast, gated CT scan was obtained with axial slices of 3 mm through the heart for calcium scoring. Calcium scoring was performed using the Agatston method. A 120 kV prospective, gated, contrast cardiac scan was obtained. Gantry rotation speed was 250 msecs and collimation was 0.6 mm. Two sublingual nitroglycerin tablets (0.8 mg) were given. The 3D data set was reconstructed in 5% intervals of the 35-75% of the R-R cycle. Diastolic phases were analyzed on a dedicated  workstation using MPR, MIP, and VRT modes. The patient received 95 cc of contrast.  FINDINGS: Image quality: Good.  Noise artifact is: Limited.  Coronary Arteries:  Normal coronary origin.  Right dominance.  Left main: The left main is a large caliber vessel with a normal take off from the  left coronary cusp that trifurcates into a LAD, LCX, and ramus intermedius. There is no plaque or stenosis.  Left anterior descending artery: The LAD has mild (25-49) calcified plaque in the proximal, mid and distal vessel. The LAD gives off 2 small patent diagonal branches.  Ramus intermedius: Small, patent with no evidence of plaque or stenosis.  Left circumflex artery: The LCX is non-dominant and patent with minimal (0-24) calcified plaque. The LCX gives off large, branching OM1 (minimal plaque in the proximal vessel and mild (25-49) in the mid vessel); small OM2.  Right coronary artery: The RCA is dominant with normal take off from the right coronary cusp. There is mild (25-49) soft plaque in the proximal vessel followed by minimal plaque followed by moderate (50-69) mixed plaque stenosis in the mid vessel; mild (25-49) calcified plaque in the distal vessel. The RCA terminates as a PDA and right posterolateral branch without evidence of plaque or stenosis.  Right Atrium: Right atrial size is within normal limits.  Right Ventricle: The right ventricular cavity is within normal limits.  Left Atrium: Left atrial size is normal in size with no left atrial appendage filling defect.  Left Ventricle: The ventricular cavity size is within normal limits.  Pulmonary arteries: Normal in size.  Pulmonary veins: Normal pulmonary venous drainage.  Pericardium: Normal thickness without significant effusion or calcium present.  Cardiac valves: The aortic valve is trileaflet without significant calcification. The mitral valve is normal without significant calcification.  Aorta: Normal caliber with aortic atherosclerosis.  Extra-cardiac findings: See attached radiology report for non-cardiac structures.  IMPRESSION: 1. Coronary calcium score of 623. This was 71 percentile for age-, sex, and race-matched controls.  2. Total plaque volume 911 mm3 which is 80 percentile for age-  and sex-matched controls (calcified plaque 130 mm3; non-calcified plaque 781 mm3). TPV is (extensive).  3. Normal coronary origin with right dominance.  4. Mild stenoses in the LAD, OM; moderate (50-69) stenosis in the mid RCA.  5. Aortic atherosclerosis.  6. Study will be sent for FFR.  RECOMMENDATIONS: CAD-RADS 3: Moderate stenosis. Consider symptom-guided anti-ischemic pharmacotherapy as well as risk factor modification per guideline directed care. Additional analysis with CT FFR will be submitted.  Olga Millers, MD  Electronically Signed: By: Olga Millers M.D. On: 01/14/2023 13:13          Risk Assessment/Calculations:             Physical Exam:   VS:  BP 130/80   Pulse 72   Ht 6\' 1"  (1.854 m)   Wt 228 lb (103.4 kg)   SpO2 97%   BMI 30.08 kg/m    Wt Readings from Last 3 Encounters:  06/24/23 228 lb (103.4 kg)  04/06/23 235 lb 3.2 oz (106.7 kg)  01/04/23 232 lb (105.2 kg)    GEN: Well nourished, well developed in no acute distress NECK: No JVD; No carotid bruits CARDIAC: RRR, no murmurs, rubs, gallops RESPIRATORY:  Clear to auscultation without rales, wheezing or rhonchi  ABDOMEN: Soft, non-tender, non-distended EXTREMITIES:  No edema; No deformity   ASSESSMENT AND PLAN: .   CAD -nonobstructive per coronary CTA with a calcium score 623, placing him in the  92nd percentile.  We want to continue with aggressive risk factor modifications including weight loss, exercise, adhering to his medication regimen.  Continue aspirin 81 mg daily, continue Zetia 10 mg daily, continue Crestor 20 mg daily.  Start Wegovy to help augment his weight loss and reduce future coronary events.   Hypertension-blood pressure is controlled in the office today at 130/80, continue olmesartan/HCTZ 40-25 mg daily.  He was previously on triple therapy with amlodipine however this was stopped secondary to manufacturer.  If his blood pressure does not come down with his weight loss is  anticipated, and we need to start something in the future we will restart amlodipine.  Dyslipidemia-LDL is well-controlled at 64, triglycerides 108.  Continue Crestor 20 mg daily and Zetia 10 mg daily.  Obesity - BMI is elevated at 30, we reviewed his dietary intake, he has had some success with weight loss but it has stalled and he cannot seem to lose anymore weight.         Dispo: Start [place], follow-up in 3 months for follow-up of weight loss.  Signed, Flossie Dibble, NP

## 2023-06-24 ENCOUNTER — Encounter: Payer: Self-pay | Admitting: Cardiology

## 2023-06-24 ENCOUNTER — Encounter: Payer: Self-pay | Admitting: *Deleted

## 2023-06-24 ENCOUNTER — Ambulatory Visit: Payer: Managed Care, Other (non HMO) | Attending: Cardiology | Admitting: Cardiology

## 2023-06-24 VITALS — BP 130/80 | HR 72 | Ht 73.0 in | Wt 228.0 lb

## 2023-06-24 DIAGNOSIS — I10 Essential (primary) hypertension: Secondary | ICD-10-CM | POA: Diagnosis not present

## 2023-06-24 DIAGNOSIS — R931 Abnormal findings on diagnostic imaging of heart and coronary circulation: Secondary | ICD-10-CM | POA: Diagnosis not present

## 2023-06-24 DIAGNOSIS — N401 Enlarged prostate with lower urinary tract symptoms: Secondary | ICD-10-CM | POA: Insufficient documentation

## 2023-06-24 DIAGNOSIS — I2510 Atherosclerotic heart disease of native coronary artery without angina pectoris: Secondary | ICD-10-CM

## 2023-06-24 DIAGNOSIS — E782 Mixed hyperlipidemia: Secondary | ICD-10-CM | POA: Diagnosis not present

## 2023-06-24 DIAGNOSIS — E669 Obesity, unspecified: Secondary | ICD-10-CM

## 2023-06-24 HISTORY — DX: Benign prostatic hyperplasia with lower urinary tract symptoms: N40.1

## 2023-06-24 MED ORDER — SEMAGLUTIDE-WEIGHT MANAGEMENT 2.4 MG/0.75ML ~~LOC~~ SOAJ: 2.4000 mg | SUBCUTANEOUS | 0 refills | Status: DC

## 2023-06-24 MED ORDER — SEMAGLUTIDE-WEIGHT MANAGEMENT 1.7 MG/0.75ML ~~LOC~~ SOAJ: 1.7000 mg | SUBCUTANEOUS | 0 refills | Status: DC

## 2023-06-24 MED ORDER — SEMAGLUTIDE-WEIGHT MANAGEMENT 0.25 MG/0.5ML ~~LOC~~ SOAJ: 0.2500 mg | SUBCUTANEOUS | 0 refills | Status: AC

## 2023-06-24 MED ORDER — SEMAGLUTIDE-WEIGHT MANAGEMENT 1 MG/0.5ML ~~LOC~~ SOAJ: 1.0000 mg | SUBCUTANEOUS | 0 refills | Status: AC

## 2023-06-24 MED ORDER — SEMAGLUTIDE-WEIGHT MANAGEMENT 0.5 MG/0.5ML ~~LOC~~ SOAJ: 0.5000 mg | SUBCUTANEOUS | 0 refills | Status: AC

## 2023-06-24 NOTE — Patient Instructions (Signed)
Medication Instructions:  Your physician has recommended you make the following change in your medication:  Start Wegovy per instructions from pharmacy. May take some time to get it approved and available to take  *If you need a refill on your cardiac medications before your next appointment, please call your pharmacy*   Lab Work: NONE If you have labs (blood work) drawn today and your tests are completely normal, you will receive your results only by: MyChart Message (if you have MyChart) OR A paper copy in the mail If you have any lab test that is abnormal or we need to change your treatment, we will call you to review the results.   Testing/Procedures: NONE   Follow-Up: At [facility], you and your health needs are our priority.  As part of our continuing mission to provide you with exceptional heart care, we have created designated Provider Care Teams.  These Care Teams include your primary Cardiologist (physician) and Advanced Practice Providers (APPs -  Physician Assistants and Nurse Practitioners) who all work together to provide you with the care you need, when you need it.  We recommend signing up for the patient portal called "MyChart".  Sign up information is provided on this After Visit Summary.  MyChart is used to connect with patients for Virtual Visits (Telemedicine).  Patients are able to view lab/test results, encounter notes, upcoming appointments, etc.  Non-urgent messages can be sent to your provider as well.   To learn more about what you can do with MyChart, go to [email or web address].    Your next appointment:   3 month(s)  Provider:   Norman Herrlich, MD or Wallis Bamberg   Other Instructions

## 2023-07-11 ENCOUNTER — Telehealth: Payer: Self-pay | Admitting: Cardiology

## 2023-07-11 NOTE — Telephone Encounter (Signed)
Patient in today because he is having acid reflux, only since he started taking Zetia two months ago- he takes Prilosec daily, and he isn't sure Zetia is the problem but he would like to know if there's something he could take that may not cause this or if Dr. Dulce Sellar recommended any other acid reflux medication.  Please call [phone]

## 2023-07-18 NOTE — Telephone Encounter (Signed)
Called patient and he reported that the acid reflux that he was having has gone away. He was concerned before that it might be medication related but he stated that he is fine now and the acid reflux has gone away. Patient had no further questions at this time.

## 2023-10-11 ENCOUNTER — Encounter: Payer: Self-pay | Admitting: Cardiology

## 2023-10-11 NOTE — Progress Notes (Unsigned)
 Cardiology Office Note:    Date:  10/12/2023   ID:  Jason Forbes, DOB 08-25-1960, MRN [id number]  PCP:  Galvin Proffer, MD  Cardiologist:  Norman Herrlich, MD    Referring MD: Galvin Proffer, MD    ASSESSMENT:    1. Coronary artery disease involving native heart, unspecified vessel or lesion type, unspecified whether angina present   2. Agatston coronary artery calcium score greater than 400   3. Primary hypertension   4. Mixed hyperlipidemia    PLAN:    In order of problems listed above:  Jason Forbes has done well he is on good medical therapy including aspirin combined lipid-lowering with high intensity statin Zetia and antihypertensives blood pressure is at target good LDL response less than 70 and having no anginal discomfort.   With labs tomorrow also check ApoB to see if he is optimized I like to see a level less than 60 Continue his current antihypertensive ARB thiazide diuretic Continue fish oil over-the-counter I intensity statin and Zetia   Next appointment: 9 months   Medication Adjustments/Labs and Tests Ordered: Current medicines are reviewed at length with the patient today.  Concerns regarding medicines are outlined above.  No orders of the defined types were placed in this encounter.  No orders of the defined types were placed in this encounter.    History of Present Illness:    Jason Forbes is a 63 y.o. 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  Compliance with diet, lifestyle and medications: Yes  He researched side effects and decided not to take semaglutide and is committed to weight loss with dietary restriction and physical activity He is feeling well is having no angina edema shortness of breath palpitation or syncope LPA was  normal He did tolerates his lipid-lowering therapy high intensity statin and Zetia without muscle pain or weakness He is having labs tomorrow in his PCP office and will give a handwritten note asking them to do an Apo B to see if he is optimized goal less than 60 He asked me about a PSA being drawn to my office I told him is outside the scope of my practice he needs a digital rectal exam combined with the blood test we will pursue it tomorrow and his primary care physician's office Past Medical History:  Diagnosis Date   Allergy    Benign prostatic hyperplasia with lower urinary tract symptoms 06/24/2023   CAD (coronary artery disease) 01/18/2023   Chronic low back pain 06/10/2020   Foot drop, left 06/10/2020   GERD (gastroesophageal reflux disease)    Hyperlipidemia    Hypertension    PONV (postoperative nausea and vomiting)    r/t morphine   Right hip pain 06/10/2020    Current Medications: Current Meds  Medication Sig   aspirin EC 81 MG tablet Take 81 mg by mouth daily. Swallow whole.   cetirizine (ZYRTEC) 10 MG tablet Take 10 mg by mouth daily.   Cholecalciferol (D 1000) 25 MCG (1000 UT) capsule Take 1,000 Units by mouth daily.   ezetimibe (ZETIA) 10 MG tablet Take 1 tablet (10 mg total) by mouth daily.   meclizine (ANTIVERT) 25 MG tablet Take 25 mg by mouth as needed for dizziness or nausea.   Multiple Vitamin (MULTIVITAMIN) tablet Take 1 tablet by  mouth daily.   olmesartan-hydrochlorothiazide (BENICAR HCT) 40-25 MG tablet Take 1 tablet by mouth daily.   Omega-3 Fatty Acids (FISH OIL OMEGA-3 PO) Take by mouth.   omeprazole (PRILOSEC) 20 MG capsule Take 20 mg by mouth daily.   rosuvastatin (CRESTOR) 20 MG tablet Take 20 mg by mouth daily.   traZODone (DESYREL) 100 MG tablet Take 100 mg by mouth at bedtime as needed for sleep.      EKGs/Labs/Other Studies Reviewed:    The following studies were reviewed today:  Cardiac Studies & Procedures    ______________________________________________________________________________________________          CT SCANS  CT CORONARY MORPH W/CTA COR W/SCORE 01/14/2023  Addendum 01/20/2023  1:01 PM ADDENDUM REPORT: 01/20/2023 12:58  EXAM: OVER-READ INTERPRETATION  CT CHEST  The following report is an over-read performed by radiologist Dr. Narda Rutherford of [facility] on 01/20/2023. This over-read does not include interpretation of cardiac or coronary anatomy or pathology. The coronary CTA interpretation by the cardiologist is attached.  COMPARISON:  None.  FINDINGS: Vascular: The included aorta is normal in caliber.  Mediastinum/nodes: No adenopathy or mass. Unremarkable esophagus.  Lungs: No focal airspace disease. No pulmonary nodule. No pleural fluid. The included airways are patent.  Upper abdomen: No acute findings. 12 mm cyst in the left lobe of the liver, needs no further imaging follow-up.  Musculoskeletal: There are no acute or suspicious osseous abnormalities. Thoracic spondylosis with anterior spurring.  IMPRESSION: No acute or unexpected extracardiac findings.   Electronically Signed By: Narda Rutherford M.D. On: 01/20/2023 12:58  Narrative CLINICAL DATA:  63 yo male with dyspnea  EXAM: Cardiac/Coronary CTA  TECHNIQUE: A non-contrast, gated CT scan was obtained with axial slices of 3 mm through the heart for calcium scoring. Calcium scoring was performed using the Agatston method. A 120 kV prospective, gated, contrast cardiac scan was obtained. Gantry rotation speed was 250 msecs and collimation was 0.6 mm. Two sublingual nitroglycerin tablets (0.8 mg) were given. The 3D data set was reconstructed in 5% intervals of the 35-75% of the R-R cycle. Diastolic phases were analyzed on a dedicated workstation using MPR, MIP, and VRT modes. The patient received 95 cc of contrast.  FINDINGS: Image quality: Good.  Noise artifact is:  Limited.  Coronary Arteries:  Normal coronary origin.  Right dominance.  Left main: The left main is a large caliber vessel with a normal take off from the left coronary cusp that trifurcates into a LAD, LCX, and ramus intermedius. There is no plaque or stenosis.  Left anterior descending artery: The LAD has mild (25-49) calcified plaque in the proximal, mid and distal vessel. The LAD gives off 2 small patent diagonal branches.  Ramus intermedius: Small, patent with no evidence of plaque or stenosis.  Left circumflex artery: The LCX is non-dominant and patent with minimal (0-24) calcified plaque. The LCX gives off large, branching OM1 (minimal plaque in the proximal vessel and mild (25-49) in the mid vessel); small OM2.  Right coronary artery: The RCA is dominant with normal take off from the right coronary cusp. There is mild (25-49) soft plaque in the proximal vessel followed by minimal plaque followed by moderate (50-69) mixed plaque stenosis in the mid vessel; mild (25-49) calcified plaque in the distal vessel. The RCA terminates as a PDA and right posterolateral branch without evidence of plaque or stenosis.  Right Atrium: Right atrial size is within normal limits.  Right Ventricle: The right ventricular cavity is within normal limits.  Left Atrium: Left atrial size is normal in size with no left atrial appendage filling defect.  Left Ventricle: The ventricular cavity size is within normal limits.  Pulmonary arteries: Normal in size.  Pulmonary veins: Normal pulmonary venous drainage.  Pericardium: Normal thickness without significant effusion or calcium present.  Cardiac valves: The aortic valve is trileaflet without significant calcification. The mitral valve is normal without significant calcification.  Aorta: Normal caliber with aortic atherosclerosis.  Extra-cardiac findings: See attached radiology report for non-cardiac structures.  IMPRESSION: 1.  Coronary calcium score of 623. This was 48 percentile for age-, sex, and race-matched controls.  2. Total plaque volume 911 mm3 which is 80 percentile for age- and sex-matched controls (calcified plaque 130 mm3; non-calcified plaque 781 mm3). TPV is (extensive).  3. Normal coronary origin with right dominance.  4. Mild stenoses in the LAD, OM; moderate (50-69) stenosis in the mid RCA.  5. Aortic atherosclerosis.  6. Study will be sent for FFR.  RECOMMENDATIONS: CAD-RADS 3: Moderate stenosis. Consider symptom-guided anti-ischemic pharmacotherapy as well as risk factor modification per guideline directed care. Additional analysis with CT FFR will be submitted.  Olga Millers, MD  Electronically Signed: By: Olga Millers M.D. On: 01/14/2023 13:13     ______________________________________________________________________________________________          Recent Labs: 01/05/2023: BUN 24; Creatinine, Ser 1.05; Potassium 4.4; Sodium 140  Recent Lipid Panel    Component Value Date/Time   CHOL 129 06/22/2023 1146   TRIG 108 06/22/2023 1146   HDL 45 06/22/2023 1146   CHOLHDL 2.9 06/22/2023 1146   LDLCALC 64 06/22/2023 1146    Physical Exam:    VS:  BP 120/82   Pulse 73   Ht 6\' 1"  (1.854 m)   Wt 232 lb 3.2 oz (105.3 kg)   SpO2 96%   BMI 30.64 kg/m     Wt Readings from Last 3 Encounters:  10/12/23 232 lb 3.2 oz (105.3 kg)  06/24/23 228 lb (103.4 kg)  04/06/23 235 lb 3.2 oz (106.7 kg)     GEN: He has no xanthoma or xanthelasma well nourished, well developed in no acute distress HEENT: Normal NECK: No JVD; No carotid bruits LYMPHATICS: No lymphadenopathy CARDIAC: RRR, no murmurs, rubs, gallops RESPIRATORY:  Clear to auscultation without rales, wheezing or rhonchi  ABDOMEN: Soft, non-tender, non-distended MUSCULOSKELETAL:  No edema; No deformity  SKIN: Warm and dry NEUROLOGIC:  Alert and oriented x 3 PSYCHIATRIC:  Normal affect    Signed, Norman Herrlich, MD   10/12/2023 8:40 AM    [facility]

## 2023-10-12 ENCOUNTER — Encounter: Payer: Self-pay | Admitting: Cardiology

## 2023-10-12 ENCOUNTER — Ambulatory Visit: Payer: Managed Care, Other (non HMO) | Attending: Cardiology | Admitting: Cardiology

## 2023-10-12 VITALS — BP 120/82 | HR 73 | Ht 73.0 in | Wt 232.2 lb

## 2023-10-12 DIAGNOSIS — I2510 Atherosclerotic heart disease of native coronary artery without angina pectoris: Secondary | ICD-10-CM | POA: Diagnosis not present

## 2023-10-12 DIAGNOSIS — R931 Abnormal findings on diagnostic imaging of heart and coronary circulation: Secondary | ICD-10-CM | POA: Diagnosis not present

## 2023-10-12 DIAGNOSIS — I10 Essential (primary) hypertension: Secondary | ICD-10-CM

## 2023-10-12 DIAGNOSIS — E782 Mixed hyperlipidemia: Secondary | ICD-10-CM

## 2023-10-12 NOTE — Patient Instructions (Signed)
 Medication Instructions:  Your physician recommends that you continue on your current medications as directed. Please refer to the Current Medication list given to you today.  *If you need a refill on your cardiac medications before your next appointment, please call your pharmacy*   Lab Work: None If you have labs (blood work) drawn today and your tests are completely normal, you will receive your results only by: MyChart Message (if you have MyChart) OR A paper copy in the mail If you have any lab test that is abnormal or we need to change your treatment, we will call you to review the results.   Testing/Procedures: None   Follow-Up: At [facility], you and your health needs are our priority.  As part of our continuing mission to provide you with exceptional heart care, we have created designated Provider Care Teams.  These Care Teams include your primary Cardiologist (physician) and Advanced Practice Providers (APPs -  Physician Assistants and Nurse Practitioners) who all work together to provide you with the care you need, when you need it.  We recommend signing up for the patient portal called "MyChart".  Sign up information is provided on this After Visit Summary.  MyChart is used to connect with patients for Virtual Visits (Telemedicine).  Patients are able to view lab/test results, encounter notes, upcoming appointments, etc.  Non-urgent messages can be sent to your provider as well.   To learn more about what you can do with MyChart, go to [email or web address].    Your next appointment:   9 month(s)  Provider:   Norman Herrlich, MD   Other Instructions Have an Apo B drawn with your labs tomorrow at Dr. Sebastian Ache office.

## 2023-10-14 LAB — LAB REPORT - SCANNED
A1c: 5.8
Calcium: 9.7
EGFR: 66
PSA, FREE: 0.62
PSA, Total: 3.35
TSH: 0.64 (ref 0.41–5.90)

## 2024-02-18 ENCOUNTER — Other Ambulatory Visit: Payer: Self-pay | Admitting: Cardiology

## 2024-05-05 LAB — LAB REPORT - SCANNED
EGFR: 78
PSA, FREE: 0.7
TSH: 0.37 — AB (ref 0.41–5.90)

## 2024-09-28 ENCOUNTER — Ambulatory Visit: Admitting: Cardiology
